# Patient Record
Sex: Male | Born: 1990 | Race: Black or African American | Hispanic: No | Marital: Single | Smoking: Never smoker
Health system: Southern US, Community
[De-identification: ages and names within clinical notes are randomized; demographics above are authoritative.]

## PROBLEM LIST (undated history)

## (undated) DIAGNOSIS — R229 Localized swelling, mass and lump, unspecified: Secondary | ICD-10-CM

## (undated) DIAGNOSIS — IMO0002 Reserved for concepts with insufficient information to code with codable children: Secondary | ICD-10-CM

---
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
## 2013-12-03 ENCOUNTER — Emergency Department (HOSPITAL_COMMUNITY)
Admission: EM | Admit: 2013-12-03 | Discharge: 2013-12-03 | Disposition: A | Discharge status: Home / Self Care | Payer: 59 | Attending: Emergency Medicine | Admitting: Emergency Medicine

## 2013-12-03 ENCOUNTER — Encounter (HOSPITAL_COMMUNITY): Payer: Self-pay | Admitting: Emergency Medicine

## 2013-12-03 DIAGNOSIS — M65839 Other synovitis and tenosynovitis, unspecified forearm: Secondary | ICD-10-CM | POA: Insufficient documentation

## 2013-12-03 DIAGNOSIS — M67439 Ganglion, unspecified wrist: Secondary | ICD-10-CM

## 2013-12-03 DIAGNOSIS — M778 Other enthesopathies, not elsewhere classified: Secondary | ICD-10-CM

## 2013-12-03 DIAGNOSIS — M65849 Other synovitis and tenosynovitis, unspecified hand: Secondary | ICD-10-CM

## 2013-12-03 DIAGNOSIS — M6740 Ganglion, unspecified site: Secondary | ICD-10-CM | POA: Insufficient documentation

## 2013-12-03 MED ORDER — NAPROXEN 500 MG PO TABS: 500.0000 mg | ORAL_TABLET | Freq: Two times a day (BID) | ORAL | Status: DC

## 2013-12-03 NOTE — Discharge Instructions (Signed)
Take the prescribed medication as directed to help with pain.  Wear splint for comfort. Follow-up with hand surgery for evaluation of surgical removal/drainage of your cyst. Return to the ED for new or worsening symptoms.

## 2013-12-03 NOTE — ED Provider Notes (Signed)
CSN: [id number]     Arrival date & time 12/03/13  1756 History   First MD Initiated Contact with Patient 12/03/13 1814     Chief Complaint  Patient presents with  . Wrist Pain    raised, swollen area on l/wrist     (Consider location/radiation/quality/duration/timing/severity/associated sxs/prior Treatment) The history is provided by the patient and medical records.   This is a 23 year old male with no significant past medical history presenting to the ED for "bump" on left wrist x 1 month.  States it has been increasing in size over the past 2 weeks. Patient also complains of a "nagging, aching" pain in his wrist while working.  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  Stats pain mostly occurs with movement of wrist, better when it is still.  Denies numbness or paresthesias.  He is right hand dominant.    History reviewed. No pertinent past medical history. History reviewed. No pertinent past surgical history. Family History  Problem Relation Age of Onset  . Diabetes Mother   . Hypertension Father   . Diabetes Father    History  Substance Use Topics  . Smoking status: Never Smoker   . Smokeless tobacco: Not on file  . Alcohol Use: Yes     Comment: occ    Review of Systems  Musculoskeletal: Positive for arthralgias.  All other systems reviewed and are negative.     Allergies  Review of patient's allergies indicates no known allergies.  Home Medications   Prior to Admission medications   Not on File   BP 129/64  Pulse 76  Temp(Src) 98.6 F (37 C) (Oral)  Resp 18  SpO2 100%  Physical Exam  Nursing note and vitals reviewed. Constitutional: He is oriented to person, place, and time. He appears well-developed and well-nourished. No distress.  HENT:  Head: Normocephalic and atraumatic.  Mouth/Throat: Oropharynx is clear and moist.  Eyes: Conjunctivae and EOM are normal. Pupils  are equal, round, and reactive to light.  Neck: Normal range of motion. Neck supple.  Cardiovascular: Normal rate, regular rhythm and normal heart sounds.   Pulmonary/Chest: Effort normal and breath sounds normal. No respiratory distress. He has no wheezes.  Musculoskeletal: Normal range of motion.  Left wrist with moderate sized ganglion cyst along dorsal aspect; full ROM of wrist maintained without difficulty; no gross bony deformities; strong radial pulse and cap refill; sensation intact diffusely throughout leg  Neurological: He is alert and oriented to person, place, and time.  Skin: Skin is warm and dry. He is not diaphoretic.  Psychiatric: He has a normal mood and affect.    ED Course  Procedures (including critical care time) Labs Review Labs Reviewed - No data to display  Imaging Review No results found.   EKG Interpretation None      MDM   Final diagnoses:  Ganglion cyst of wrist  Tendonitis of wrist, left   Ganglion cyst of dorsal left wrist, wrist is otherwise within normal limits and neurovascularly intact. He will be referred to hand surgery for surgical removal/drainage of cyst.  His wrist pain sx seem to be related to tendonitis given his repetitive motions.  Pt placed in wrist splint, started on naprosyn.  Discussed plan with patient, he/she acknowledged understanding and agreed with plan of care.  Return precautions given for new or worsening symptoms.  Garlon Hatchet, PA-C 12/03/13 1940

## 2013-12-03 NOTE — ED Notes (Signed)
Raised area on l/wrist x 1 month. Increased in size-approx. 1.5cm diameter.  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

## 2013-12-03 NOTE — ED Provider Notes (Signed)
Medical screening examination/treatment/procedure(s) were performed by non-physician practitioner and as supervising physician I was immediately available for consultation/collaboration.   EKG Interpretation None       Ethelda Chick, MD 12/03/13 1958

## 2013-12-09 ENCOUNTER — Encounter (HOSPITAL_BASED_OUTPATIENT_CLINIC_OR_DEPARTMENT_OTHER): Payer: Self-pay | Admitting: *Deleted

## 2013-12-09 NOTE — Progress Notes (Signed)
Pt instructed npo p mn tonight . Needs hgb on arrival

## 2013-12-17 ENCOUNTER — Encounter (HOSPITAL_BASED_OUTPATIENT_CLINIC_OR_DEPARTMENT_OTHER)
Admission: RE | Disposition: A | Discharge status: Home / Self Care | Payer: Self-pay | Source: Ambulatory Visit | Attending: Orthopedic Surgery

## 2013-12-17 ENCOUNTER — Encounter (HOSPITAL_BASED_OUTPATIENT_CLINIC_OR_DEPARTMENT_OTHER): Payer: Self-pay | Admitting: *Deleted

## 2013-12-17 ENCOUNTER — Ambulatory Visit (HOSPITAL_BASED_OUTPATIENT_CLINIC_OR_DEPARTMENT_OTHER): Payer: Managed Care, Other (non HMO) | Admitting: Anesthesiology

## 2013-12-17 ENCOUNTER — Encounter (HOSPITAL_BASED_OUTPATIENT_CLINIC_OR_DEPARTMENT_OTHER): Payer: Managed Care, Other (non HMO) | Admitting: Anesthesiology

## 2013-12-17 ENCOUNTER — Ambulatory Visit (HOSPITAL_BASED_OUTPATIENT_CLINIC_OR_DEPARTMENT_OTHER)
Admission: RE | Admit: 2013-12-17 | Discharge: 2013-12-17 | Disposition: A | Discharge status: Home / Self Care | Payer: Managed Care, Other (non HMO) | Source: Ambulatory Visit | Attending: Orthopedic Surgery | Admitting: Orthopedic Surgery

## 2013-12-17 DIAGNOSIS — R2232 Localized swelling, mass and lump, left upper limb: Secondary | ICD-10-CM

## 2013-12-17 DIAGNOSIS — Z79899 Other long term (current) drug therapy: Secondary | ICD-10-CM | POA: Insufficient documentation

## 2013-12-17 DIAGNOSIS — M6740 Ganglion, unspecified site: Secondary | ICD-10-CM | POA: Insufficient documentation

## 2013-12-17 HISTORY — DX: Localized swelling, mass and lump, unspecified: R22.9

## 2013-12-17 HISTORY — PX: MASS EXCISION: SHX2000

## 2013-12-17 HISTORY — DX: Reserved for concepts with insufficient information to code with codable children: IMO0002

## 2013-12-17 LAB — POCT HEMOGLOBIN-HEMACUE: HEMOGLOBIN: 14.2 g/dL (ref 13.0–17.0)

## 2013-12-17 SURGERY — EXCISION MASS
Anesthesia: General | Site: Wrist | Laterality: Left

## 2013-12-17 MED ORDER — FENTANYL CITRATE 0.05 MG/ML IJ SOLN
INTRAMUSCULAR | Status: DC | PRN
  Administered 2013-12-17: 50 ug via INTRAVENOUS

## 2013-12-17 MED ORDER — BUPIVACAINE HCL (PF) 0.25 % IJ SOLN
INTRAMUSCULAR | Status: DC | PRN
  Administered 2013-12-17: 10 mL

## 2013-12-17 MED ORDER — LIDOCAINE HCL (CARDIAC) 20 MG/ML IV SOLN
INTRAVENOUS | Status: DC | PRN
  Administered 2013-12-17: 100 mg via INTRAVENOUS

## 2013-12-17 MED ORDER — OXYCODONE-ACETAMINOPHEN 5-325 MG PO TABS
ORAL_TABLET | ORAL | Status: AC
  Filled 2013-12-17: qty 1

## 2013-12-17 MED ORDER — PROMETHAZINE HCL 25 MG/ML IJ SOLN
6.2500 mg | INTRAMUSCULAR | Status: DC | PRN
Start: 2013-12-17 — End: 2013-12-17
  Filled 2013-12-17: qty 1

## 2013-12-17 MED ORDER — DOCUSATE SODIUM 100 MG PO CAPS: 100.0000 mg | ORAL_CAPSULE | Freq: Two times a day (BID) | ORAL | Status: DC

## 2013-12-17 MED ORDER — DEXAMETHASONE SODIUM PHOSPHATE 4 MG/ML IJ SOLN
INTRAMUSCULAR | Status: DC | PRN
  Administered 2013-12-17: 10 mg via INTRAVENOUS

## 2013-12-17 MED ORDER — FENTANYL CITRATE 0.05 MG/ML IJ SOLN
25.0000 ug | INTRAMUSCULAR | Status: DC | PRN
Start: 2013-12-17 — End: 2013-12-17
  Filled 2013-12-17: qty 1

## 2013-12-17 MED ORDER — MIDAZOLAM HCL 5 MG/5ML IJ SOLN
INTRAMUSCULAR | Status: DC | PRN
  Administered 2013-12-17: 2 mg via INTRAVENOUS

## 2013-12-17 MED ORDER — LACTATED RINGERS IV SOLN
INTRAVENOUS | Status: DC
  Administered 2013-12-17 (×2): via INTRAVENOUS
  Filled 2013-12-17: qty 1000

## 2013-12-17 MED ORDER — OXYCODONE-ACETAMINOPHEN 5-325 MG PO TABS: 1.0000 | ORAL_TABLET | ORAL | Status: DC | PRN

## 2013-12-17 MED ORDER — FENTANYL CITRATE 0.05 MG/ML IJ SOLN
INTRAMUSCULAR | Status: AC
  Filled 2013-12-17: qty 4

## 2013-12-17 MED ORDER — CHLORHEXIDINE GLUCONATE 4 % EX LIQD
60.0000 mL | Freq: Once | CUTANEOUS | Status: DC
  Filled 2013-12-17: qty 60

## 2013-12-17 MED ORDER — OXYCODONE-ACETAMINOPHEN 5-325 MG PO TABS
1.0000 | ORAL_TABLET | Freq: Once | ORAL | Status: AC
  Administered 2013-12-17: 1 via ORAL
  Filled 2013-12-17: qty 1

## 2013-12-17 MED ORDER — PROPOFOL 10 MG/ML IV BOLUS
INTRAVENOUS | Status: DC | PRN
  Administered 2013-12-17: 200 mg via INTRAVENOUS
  Administered 2013-12-17: 100 mg via INTRAVENOUS

## 2013-12-17 MED ORDER — MIDAZOLAM HCL 2 MG/2ML IJ SOLN
INTRAMUSCULAR | Status: AC
  Filled 2013-12-17: qty 2

## 2013-12-17 MED ORDER — SODIUM CHLORIDE 0.9 % IR SOLN
Status: DC | PRN
  Administered 2013-12-17: 500 mL

## 2013-12-17 MED ORDER — ONDANSETRON HCL 4 MG/2ML IJ SOLN
INTRAMUSCULAR | Status: DC | PRN
  Administered 2013-12-17: 4 mg via INTRAVENOUS

## 2013-12-17 MED ORDER — CEFAZOLIN SODIUM-DEXTROSE 2-3 GM-% IV SOLR
2.0000 g | INTRAVENOUS | Status: AC
Start: 2013-12-17 — End: 2013-12-17
  Administered 2013-12-17: 2 g via INTRAVENOUS
  Filled 2013-12-17: qty 50

## 2013-12-17 SURGICAL SUPPLY — 46 items
BANDAGE ELASTIC 3 VELCRO ST LF (GAUZE/BANDAGES/DRESSINGS) ×6 IMPLANT
BANDAGE GAUZE ELAST BULKY 4 IN (GAUZE/BANDAGES/DRESSINGS) ×3 IMPLANT
BENZOIN TINCTURE PRP APPL 2/3 (GAUZE/BANDAGES/DRESSINGS) ×3 IMPLANT
BLADE SURG 15 STRL LF DISP TIS (BLADE) ×1 IMPLANT
BLADE SURG 15 STRL SS (BLADE) ×2
BNDG ESMARK 4X9 LF (GAUZE/BANDAGES/DRESSINGS) ×3 IMPLANT
BNDG GAUZE ELAST 4 BULKY (GAUZE/BANDAGES/DRESSINGS) ×3 IMPLANT
CLOSURE WOUND 1/2 X4 (GAUZE/BANDAGES/DRESSINGS) ×1
CORDS BIPOLAR (ELECTRODE) ×3 IMPLANT
COVER TABLE BACK 60X90 (DRAPES) ×3 IMPLANT
CUFF TOURNIQUET SINGLE 18IN (TOURNIQUET CUFF) ×3 IMPLANT
DRAPE EXTREMITY T 121X128X90 (DRAPE) ×3 IMPLANT
DRAPE LG THREE QUARTER DISP (DRAPES) ×6 IMPLANT
DRAPE SURG 17X23 STRL (DRAPES) ×3 IMPLANT
DRAPE U-SHAPE 47X51 STRL (DRAPES) IMPLANT
GLOVE BIOGEL PI IND STRL 8.5 (GLOVE) ×1 IMPLANT
GLOVE BIOGEL PI INDICATOR 8.5 (GLOVE) ×2
GLOVE SURG ORTHO 8.0 STRL STRW (GLOVE) ×3 IMPLANT
GOWN STRL REUS W/ TWL LRG LVL3 (GOWN DISPOSABLE) ×1 IMPLANT
GOWN STRL REUS W/ TWL XL LVL3 (GOWN DISPOSABLE) ×1 IMPLANT
GOWN STRL REUS W/TWL LRG LVL3 (GOWN DISPOSABLE) ×2
GOWN STRL REUS W/TWL XL LVL3 (GOWN DISPOSABLE) ×2
NEEDLE HYPO 25X1 1.5 SAFETY (NEEDLE) ×3 IMPLANT
NS IRRIG 500ML POUR BTL (IV SOLUTION) ×3 IMPLANT
PACK BASIN DAY SURGERY FS (CUSTOM PROCEDURE TRAY) ×3 IMPLANT
PAD CAST 3X4 CTTN HI CHSV (CAST SUPPLIES) ×1 IMPLANT
PADDING CAST ABS 3INX4YD NS (CAST SUPPLIES) ×2
PADDING CAST ABS COTTON 3X4 (CAST SUPPLIES) ×1 IMPLANT
PADDING CAST COTTON 3X4 STRL (CAST SUPPLIES) ×2
SPLINT FIBERGLASS 3X35 (CAST SUPPLIES) ×3 IMPLANT
SPLINT PLASTER CAST XFAST 3X15 (CAST SUPPLIES) IMPLANT
SPLINT PLASTER XTRA FASTSET 3X (CAST SUPPLIES)
SPONGE GAUZE 4X4 12PLY (GAUZE/BANDAGES/DRESSINGS) ×3 IMPLANT
SPONGE GAUZE 4X4 12PLY STER LF (GAUZE/BANDAGES/DRESSINGS) ×3 IMPLANT
STOCKINETTE 4X48 STRL (DRAPES) ×3 IMPLANT
STRIP CLOSURE SKIN 1/2X4 (GAUZE/BANDAGES/DRESSINGS) ×2 IMPLANT
SUCTION FRAZIER TIP 10 FR DISP (SUCTIONS) ×3 IMPLANT
SUT MNCRL AB 4-0 PS2 18 (SUTURE) ×3 IMPLANT
SUT PROLENE 4 0 PS 2 18 (SUTURE) ×3 IMPLANT
SYR BULB 3OZ (MISCELLANEOUS) ×3 IMPLANT
SYR CONTROL 10ML LL (SYRINGE) ×3 IMPLANT
TOWEL OR 17X24 6PK STRL BLUE (TOWEL DISPOSABLE) ×3 IMPLANT
TRAY DSU PREP LF (CUSTOM PROCEDURE TRAY) ×3 IMPLANT
TUBE CONNECTING 12'X1/4 (SUCTIONS) ×1
TUBE CONNECTING 12X1/4 (SUCTIONS) ×2 IMPLANT
UNDERPAD 30X30 INCONTINENT (UNDERPADS AND DIAPERS) ×3 IMPLANT

## 2013-12-17 NOTE — Brief Op Note (Signed)
12/17/2013  1:04 PM  PATIENT:  Francisco Patterson  23 y.o. male  PRE-OPERATIVE DIAGNOSIS:  LEFT WRIST DEEP MASS   POST-OPERATIVE DIAGNOSIS:  SAME  PROCEDURE:  Procedure(s): LEFT WRIST DEEP MASS EXCISION (Left)  SURGEON:  Surgeon(s) and Role:    * Sharma CovertFred W Ortmann, MD - Primary  PHYSICIAN ASSISTANT:   ASSISTANTS: none   ANESTHESIA:   general  EBL:     BLOOD ADMINISTERED:none  DRAINS: none   LOCAL MEDICATIONS USED:  MARCAINE     SPECIMEN:  No Specimen  DISPOSITION OF SPECIMEN:  N/A  COUNTS:  YES  TOURNIQUET:    DICTATIO: [id number]  PLAN OF CARE: Discharge to home after PACU  PATIENT DISPOSITION:  PACU - hemodynamically stable.   Delay start of Pharmacological VTE agent (>24hrs) due to surgical blood loss or risk of bleeding: not applicable

## 2013-12-17 NOTE — Anesthesia Procedure Notes (Signed)
Procedure Name: LMA Insertion Date/Time: 12/17/2013 2:07 PM Performed by: Tyrone NineSAUVE, ROBIN F Pre-anesthesia Checklist: Patient identified, Timeout performed, Emergency Drugs available, Suction available and Patient being monitored Patient Re-evaluated:Patient Re-evaluated prior to inductionOxygen Delivery Method: Circle system utilized Preoxygenation: Pre-oxygenation with 100% oxygen Intubation Type: IV induction Ventilation: Mask ventilation without difficulty LMA: LMA inserted LMA Size: 5.0 Number of attempts: 1 Placement Confirmation: breath sounds checked- equal and bilateral Tube secured with: Tape Dental Injury: Teeth and Oropharynx as per pre-operative assessment

## 2013-12-17 NOTE — Anesthesia Postprocedure Evaluation (Signed)
Anesthesia Post Note  Patient: Francisco Patterson  Procedure(s) Performed: Procedure(s) (LRB): LEFT WRIST DEEP MASS EXCISION (Left)  Anesthesia type: General  Patient location: PACU  Post pain: Pain level controlled  Post assessment: Post-op Vital signs reviewed  Last Vitals: BP 122/72  Pulse 62  Temp(Src) 36.3 C (Oral)  Resp 14  Ht 6\' 1"  (1.854 m)  Wt 185 lb (83.915 kg)  BMI 24.41 kg/m2  SpO2 100%  Post vital signs: Reviewed  Level of consciousness: sedated  Complications: No apparent anesthesia complications

## 2013-12-17 NOTE — Anesthesia Preprocedure Evaluation (Signed)

## 2013-12-17 NOTE — H&P (Signed)
Francisco Patterson is an 23 y.o. male.   Chief Complaint: LEFT WRIST MASS HPI: PT WITH ENLARGING MASS OVER DORSUM OF LEFT WRIST PT HERE FOR SURGERY ON LEFT WRIST TO REMOVE MASS NO PRIOR SURGERY TO LEFT WRIST   Past Medical History  Diagnosis Date  . Mass     left wrist    History reviewed. No pertinent past surgical history.  Family History  Problem Relation Age of Onset  . Diabetes Mother   . Hypertension Father   . Diabetes Father    Social History:  reports that he has never smoked. He does not have any smokeless tobacco history on file. He reports that he drinks alcohol. His drug history is not on file.  Allergies: No Known Allergies  Medications Prior to Admission  Medication Sig Dispense Refill  . naproxen (NAPROSYN) 500 MG tablet Take 1 tablet (500 mg total) by mouth 2 (two) times daily with a meal.  30 tablet  0    Results for orders placed during the hospital encounter of 12/17/13 (from the past 48 hour(s))  POCT HEMOGLOBIN-HEMACUE     Status: None   Collection Time    12/17/13 12:04 PM      Result Value Ref Range   Hemoglobin 14.2  13.0 - 17.0 g/dL   No results found.  ROS NO RECENT ILLNESSES OR HOSPITALIZATIONS   Blood pressure 128/74, pulse 71, temperature 98.1 F (36.7 C), temperature source Oral, resp. rate 16, height 6\' 1"  (1.854 m), weight 83.915 kg (185 lb), SpO2 100.00%. Physical Exam  General Appearance:  Alert, cooperative, no distress, appears stated age  Head:  Normocephalic, without obvious abnormality, atraumatic  Eyes:  Pupils equal, conjunctiva/corneas clear,         Throat: Lips, mucosa, and tongue normal; teeth and gums normal  Neck: No visible masses     Lungs:   respirations unlabored  Chest Wall:  No tenderness or deformity  Heart:  Regular rate and rhythm,  Abdomen:   Soft, non-tender,         Extremities: LEFT WRIST: SEVERAL CM MASS OVER DORSUM OF LEFT HAND 4 CM IN SIZE OVER SL REGION GOOD WRIST AND DIGITAL MOTION FINGERS  WARM WELL PERFUSED  Pulses: 2+ and symmetric  Skin: Skin color, texture, turgor normal, no rashes or lesions     Neurologic: Normal    Assessment/Plan LEFT WRIST DORSAL DEEP MASS  LEFT WRIST DEEP MASS EXCISION  R/B/A DISCUSSED WITH PT IN OFFICE.  PT VOICED UNDERSTANDING OF PLAN CONSENT SIGNED DAY OF SURGERY PT SEEN AND EXAMINED PRIOR TO OPERATIVE PROCEDURE/DAY OF SURGERY SITE MARKED. QUESTIONS ANSWERED WILL GO HOME FOLLOWING SURGERY  Sharma CovertORTMANN,FRED W 12/17/2013, 1:01 PM

## 2013-12-17 NOTE — Transfer of Care (Signed)
Immediate Anesthesia Transfer of Care Note  Patient: Francisco Patterson  Procedure(s) Performed: Procedure(s): LEFT WRIST DEEP MASS EXCISION (Left)  Patient Location: PACU  Anesthesia Type:General  Level of Consciousness: sedated and patient cooperative  Airway & Oxygen Therapy: Patient Spontanous Breathing and Patient connected to nasal cannula oxygen  Post-op Assessment: Report given to PACU RN and Post -op Vital signs reviewed and stable  Post vital signs: Reviewed and stable  Complications: No apparent anesthesia complications

## 2013-12-17 NOTE — Discharge Instructions (Signed)
KEEP BANDAGE CLEAN AND DRY °CALL OFFICE FOR F/U APPT [phone] in 14 days °KEEP HAND ELEVATED ABOVE HEART °OK TO APPLY ICE TO OPERATIVE AREA °CONTACT OFFICE IF ANY WORSENING PAIN OR CONCERNS. ° ° °Post Anesthesia Home Care Instructions ° °Activity: °Get plenty of rest for the remainder of the day. A responsible adult should stay with you for 24 hours following the procedure.  °For the next 24 hours, DO NOT: °-Drive a car °-Operate machinery °-Drink alcoholic beverages °-Take any medication unless instructed by your physician °-Make any legal decisions or sign important papers. ° °Meals: °Start with liquid foods such as gelatin or soup. Progress to regular foods as tolerated. Avoid greasy, spicy, heavy foods. If nausea and/or vomiting occur, drink only clear liquids until the nausea and/or vomiting subsides. Call your physician if vomiting continues. ° °Special Instructions/Symptoms: °Your throat may feel dry or sore from the anesthesia or the breathing tube placed in your throat during surgery. If this causes discomfort, gargle with warm salt water. The discomfort should disappear within 24 hours. ° °

## 2013-12-18 ENCOUNTER — Encounter (HOSPITAL_BASED_OUTPATIENT_CLINIC_OR_DEPARTMENT_OTHER): Payer: Self-pay | Admitting: Orthopedic Surgery

## 2013-12-18 NOTE — Op Note (Signed)
NAMRodell Patterson:  Francisco Patterson, Francisco Patterson              ACCOUNT NO.:  [id number]  MEDICAL RECORD NO.:  [id number]  LOCATION:                                 FACILITY:  PHYSICIAN:  Madelynn DoneFred W Ortmann IV, MD  DATE OF BIRTH:  1991-03-06  DATE OF PROCEDURE:  12/17/2013 DATE OF DISCHARGE:  12/17/2013                              OPERATIVE REPORT   PREOPERATIVE DIAGNOSIS:  Left wrist deep mass, greater than 1.5 cm, subfascial.  POSTOPERATIVE DIAGNOSIS:  Left wrist deep mass, greater than 1.5 cm, subfascial.  ATTENDING PHYSICIAN:  Francisco CovertFred W Ortmann IV, MD, who was scrubbed and present for the entire procedure.  ASSISTANT SURGEON:  None.  ANESTHESIA:  General via LMA.  SURGICAL PROCEDURES: 1. Left wrist deep mass excision, subfascial, greater than 1.5 cm. 2. Left wrist joint arthrotomy and exploration, radiocarpal joint.  SURGICAL INDICATIONS:  Mr. Francisco Patterson is a right-hand-dominant gentleman with enlarging mass over the dorsal aspect of the left hand.  The patient elected to undergo the above procedure.  Risks, benefits, and alternatives were discussed in detail with the patient.  Signed informed consent was obtained.  Risks include, but are not limited to bleeding; infection; damage to nearby nerves, arteries, or tendons; loss of motion of the wrist and digits; incomplete relief of symptoms; and need for further surgical intervention.  DESCRIPTION OF PROCEDURE:  The patient was properly identified in the preop holding area, marked with a permanent marker made on the left wrist to indicate the correct operative site.  The patient was then brought back to the operating room and placed supine on the anesthesia room table where general anesthesia was administered.  The patient tolerated this well.  A well-padded tourniquet was then placed on the left brachium and sealed with 1000-drape.  Left upper extremity was then prepped and draped in normal sterile fashion.  Time-out was called, correct side was  identified and procedure was then begun.  Attention was then turned to the left wrist where a large skin incision was made then directly over the dorsal aspect of the wrist.  The limb was then elevated and tourniquet insufflated.  Dissection was carried down through the skin and subcutaneous tissues deep to the retinaculum between the fourth and second dorsal compartment.  The mass was encountered, greater than 1.5 cm mass.  Circumferential dissection was then carried around the mass particularly all the way down to the base of the mass.  The radiocarpal joint was then opened up at the level of the SL ligament, the mass appeared to be originating consistent with a ganglion cyst.  The mass was then removed at the base of the stalk. Bipolar cautery was then used to cauterize the base of the stalk, preserving the dorsal capsuloligamentous structures.  Following this, the wound was then thoroughly irrigated after arthrotomy and removal of the deep mass.  The wound was then irrigated.  Capsular closure was then carried out with 4-0 Monocryl suture.  Subcutaneous tissue was closed with 4-0 Monocryl and skin closed with running 4-0 Prolene subcuticular. Benzoin and Steri-Strips were applied.  Sterile compressive bandage was applied.  The patient was then placed in a short-arm splint, extubated, and taken to the  recovery room in good condition.  POSTPROCEDURAL PLAN:  The patient was discharged to home and seen back in the office in approximately 2 weeks for wound check, suture removal, and go over the pathology, gradually use and activity.  SPECIMENS:  Dorsal mass to Pathology.     Madelynn DoneFred W Ortmann IV, MD     FWO/MEDQ  D:  12/17/2013  T:  12/18/2013  Job:  [id number]

## 2016-02-08 ENCOUNTER — Emergency Department (HOSPITAL_COMMUNITY)
Admission: EM | Admit: 2016-02-08 | Discharge: 2016-02-08 | Disposition: A | Discharge status: Home / Self Care | Payer: Self-pay | Attending: Emergency Medicine | Admitting: Emergency Medicine

## 2016-02-08 ENCOUNTER — Encounter (HOSPITAL_COMMUNITY): Payer: Self-pay | Admitting: *Deleted

## 2016-02-08 ENCOUNTER — Emergency Department (HOSPITAL_COMMUNITY): Payer: Self-pay

## 2016-02-08 DIAGNOSIS — N202 Calculus of kidney with calculus of ureter: Secondary | ICD-10-CM | POA: Insufficient documentation

## 2016-02-08 DIAGNOSIS — N134 Hydroureter: Secondary | ICD-10-CM | POA: Insufficient documentation

## 2016-02-08 DIAGNOSIS — N200 Calculus of kidney: Secondary | ICD-10-CM

## 2016-02-08 DIAGNOSIS — N1330 Unspecified hydronephrosis: Secondary | ICD-10-CM | POA: Insufficient documentation

## 2016-02-08 LAB — URINALYSIS, ROUTINE W REFLEX MICROSCOPIC
Glucose, UA: NEGATIVE mg/dL
Ketones, ur: >80 mg/dL — AB
Nitrite: NEGATIVE
PH: 6 (ref 5.0–8.0)
Protein, ur: 30 mg/dL — AB
SPECIFIC GRAVITY, URINE: 1.039 — AB (ref 1.005–1.030)

## 2016-02-08 LAB — COMPREHENSIVE METABOLIC PANEL
ALK PHOS: 51 U/L (ref 38–126)
ALT: 23 U/L (ref 17–63)
AST: 27 U/L (ref 15–41)
Albumin: 4.5 g/dL (ref 3.5–5.0)
Anion gap: 8 (ref 5–15)
BILIRUBIN TOTAL: 0.6 mg/dL (ref 0.3–1.2)
BUN: 11 mg/dL (ref 6–20)
CALCIUM: 10 mg/dL (ref 8.9–10.3)
CO2: 26 mmol/L (ref 22–32)
Chloride: 103 mmol/L (ref 101–111)
Creatinine, Ser: 1.07 mg/dL (ref 0.61–1.24)
GFR calc non Af Amer: >60 mL/min (ref >60)
GLUCOSE: 100 mg/dL — AB (ref 65–99)
POTASSIUM: 3.7 mmol/L (ref 3.5–5.1)
SODIUM: 137 mmol/L (ref 135–145)
TOTAL PROTEIN: 8.2 g/dL — AB (ref 6.5–8.1)

## 2016-02-08 LAB — CBC
HEMATOCRIT: 43.6 % (ref 39.0–52.0)
HEMOGLOBIN: 14.1 g/dL (ref 13.0–17.0)
MCH: 28.7 pg (ref 26.0–34.0)
MCHC: 32.3 g/dL (ref 30.0–36.0)
MCV: 88.6 fL (ref 78.0–100.0)
Platelets: 263 10*3/uL (ref 150–400)
RBC: 4.92 MIL/uL (ref 4.22–5.81)
RDW: 12.7 % (ref 11.5–15.5)
WBC: 11.9 10*3/uL — ABNORMAL HIGH (ref 4.0–10.5)

## 2016-02-08 LAB — URINE MICROSCOPIC-ADD ON: Bacteria, UA: NONE SEEN

## 2016-02-08 LAB — LIPASE, BLOOD: Lipase: 15 U/L (ref 11–51)

## 2016-02-08 MED ORDER — SODIUM CHLORIDE 0.9 % IV BOLUS (SEPSIS)
1000.0000 mL | Freq: Once | INTRAVENOUS | Status: AC
  Administered 2016-02-08: 1000 mL via INTRAVENOUS

## 2016-02-08 MED ORDER — KETOROLAC TROMETHAMINE 30 MG/ML IJ SOLN
30.0000 mg | Freq: Once | INTRAMUSCULAR | Status: AC
  Administered 2016-02-08: 30 mg via INTRAVENOUS
  Filled 2016-02-08: qty 1

## 2016-02-08 MED ORDER — OXYCODONE-ACETAMINOPHEN 5-325 MG PO TABS: 1.0000 | ORAL_TABLET | ORAL | 0 refills | Status: DC | PRN

## 2016-02-08 MED ORDER — ONDANSETRON 4 MG PO TBDP: 4.0000 mg | ORAL_TABLET | Freq: Three times a day (TID) | ORAL | 0 refills | Status: DC | PRN

## 2016-02-08 NOTE — Discharge Instructions (Signed)
Take your medications as prescribed as needed for pain relief. Continue drinking fluids at home to remain hydrated. I recommend calling the Urology clinic listed above to schedule a follow up appointment for sometime in the next week for follow up regarding your enlarged prostate seen on your CT.  Please follow up with a primary care provider from the Resource Guide provided below in 1 week as needed. Please return to the Emergency Department if symptoms worsen or new onset of fever, chills, abdominal pain, vomiting, unable to keep fluids down, difficulty urinating, rectal pain.

## 2016-02-08 NOTE — ED Notes (Signed)
D/C instructions reveiwed and patient verbalizes understanding. Leaving ambulatory with minimal pain

## 2016-02-08 NOTE — ED Triage Notes (Signed)
Pt reports RLQ pain this am with n/v and blood in urine.

## 2016-02-08 NOTE — ED Provider Notes (Signed)
[facility]EM[facility]Y DEPT Provider Note   CSN: [id number] Arrival date & time: 02/08/16  [id number]     History   Chief Complaint Chief Complaint  Patient presents with  . Abdominal Pain  . Emesis  . Hematuria    HPI Francisco Patterson is a 25 y.o. male.  Patient is a 25 year old male with no pertinent past medical history presents the ED with complaint of abdominal pain, onset around 8 PM last night. Patient reports after eating pizza and drinking soda last night he began to have gradual onset of sharp waxing and waning right-sided abdominal pain. Patient reports pain is worse with certain movements. Denies radiation. He reports initially thinking the pain was related to gas but notes he has taken ibuprofen and Gas-X at home without relief. Patient reports having hematuria ("brown colored urine"), nausea and 3 episodes of NBNB vomiting. Denies fever, chills, headache or chest pain, shortness of breath, hematemesis, diarrhea, constipation, flank pain, dysuria, urinary frequency. Denies history of abdominal surgeries.      Past Medical History:  Diagnosis Date  . Mass    left wrist    There are no active problems to display for this patient.   Past Surgical History:  Procedure Laterality Date  . MASS EXCISION Left 12/17/2013   Procedure: LEFT WRIST DEEP MASS EXCISION;  Surgeon: Sharma CovertFred W Ortmann, MD;  [facility];  Service: Orthopedics;  Laterality: Left;       Home Medications    Prior to Admission medications   Medication Sig Start Date End Date Taking? Authorizing Provider  Naproxen Sodium (ALEVE) 220 MG CAPS Take 220 mg by mouth every 8 (eight) hours as needed (pain).   Yes Historical Provider, MD  simethicone (MYLICON) 80 MG chewable tablet Chew 80 mg by mouth every 6 (six) hours as needed for flatulence.   Yes Historical Provider, MD  ondansetron (ZOFRAN ODT) 4 MG disintegrating tablet Take 1 tablet (4 mg total) by mouth every 8 (eight) hours as needed  for nausea or vomiting. 02/08/16   Barrett HenleNicole Elizabeth Traniya Prichett, PA-C  oxyCODONE-acetaminophen (PERCOCET/ROXICET) 5-325 MG tablet Take 1 tablet by mouth every 4 (four) hours as needed for severe pain. 02/08/16   Barrett HenleNicole Elizabeth Avis Mcmahill, PA-C    Family History Family History  Problem Relation Age of Onset  . Diabetes Mother   . Hypertension Father   . Diabetes Father     Social History Social History  Substance Use Topics  . Smoking status: Never Smoker  . Smokeless tobacco: Never Used  . Alcohol use Yes     Comment: occ     Allergies   Review of patient's allergies indicates no known allergies.   Review of Systems Review of Systems  Gastrointestinal: Positive for abdominal pain, nausea and vomiting.  Genitourinary: Positive for hematuria.  All other systems reviewed and are negative.    Physical Exam Updated Vital Signs BP 125/66   Pulse 64   Temp 98.4 F (36.9 C) (Oral)   Resp 18   SpO2 100%   Physical Exam  Constitutional: He is oriented to person, place, and time. He appears well-developed and well-nourished. No distress.  HENT:  Head: Normocephalic and atraumatic.  Mouth/Throat: Oropharynx is clear and moist. No oropharyngeal exudate.  Eyes: Conjunctivae and EOM are normal. Right eye exhibits no discharge. Left eye exhibits no discharge. No scleral icterus.  Neck: Normal range of motion. Neck supple.  Cardiovascular: Normal rate, regular rhythm, normal heart sounds and intact distal pulses.  Pulmonary/Chest: Effort normal and breath sounds normal.  Abdominal: Soft. Bowel sounds are normal. He exhibits no distension and no mass. There is no tenderness. There is no rebound and no guarding. No hernia.  No CVA tenderness  Musculoskeletal: Normal range of motion. He exhibits no edema.  Neurological: He is alert and oriented to person, place, and time.  Skin: Skin is warm and dry. He is not diaphoretic.  Nursing note and vitals reviewed.    ED Treatments / Results   Labs (all labs ordered are listed, but only abnormal results are displayed) Labs Reviewed  COMPREHENSIVE METABOLIC PANEL - Abnormal; Notable for the following:       Result Value   Glucose, Bld 100 (*)    Total Protein 8.2 (*)    All other components within normal limits  CBC - Abnormal; Notable for the following:    WBC 11.9 (*)    All other components within normal limits  URINALYSIS, ROUTINE W REFLEX MICROSCOPIC (NOT AT [facility]) - Abnormal; Notable for the following:    Color, Urine AMBER (*)    APPearance CLOUDY (*)    Specific Gravity, Urine 1.039 (*)    Hgb urine dipstick LARGE (*)    Bilirubin Urine SMALL (*)    Ketones, ur >80 (*)    Protein, ur 30 (*)    Leukocytes, UA MODERATE (*)    All other components within normal limits  URINE MICROSCOPIC-ADD ON - Abnormal; Notable for the following:    Squamous Epithelial / LPF 0-5 (*)    All other components within normal limits  LIPASE, BLOOD    EKG  EKG Interpretation None       Radiology Ct Renal Stone Study  Result Date: 02/08/2016 CLINICAL DATA:  Right abdominal pain. Hematuria. Nausea and vomiting. EXAM: CT ABDOMEN AND PELVIS WITHOUT CONTRAST TECHNIQUE: Multidetector CT imaging of the abdomen and pelvis was performed following the standard protocol without IV contrast. COMPARISON:  None. FINDINGS: Lower chest:  Unremarkable Hepatobiliary: Numerous hypodense lesions are present in the liver enter technically nonspecific on today' s exam. An index 1.1 cm lesion in segment 2 is noted on image 12 series 2. There are at least 15 such lesions. Pancreas: Unremarkable Spleen: Unremarkable Adrenals/Urinary Tract: Adrenal glands normal. Right perirenal stranding along with mild right hydronephrosis and hydroureter extending down to a 2 mm right ureterovesical junction calculus. Additional right nonobstructive renal calculi include a 2 mm mid kidney calculus and a separate 2 mm right mid kidney calculus. On the left side there is a 3  mm calculus with an adjacent 2 mm calculus, as well as some very faint potential 1 mm nonobstructive calculi. No left hydronephrosis or hydroureter. The urinary bladder is empty during imaging but also somewhat high density and with mild surrounding stranding, cystitis is not excluded. No well-defined lumen in the urinary bladder. Stomach/Bowel: Appendix normal.  Otherwise unremarkable. Vascular/Lymphatic: Unremarkable Reproductive: There is prominence of the seminal vesicles and prostate gland for age. The prostate gland measures 4.4 by 5.7 by 5.9 cm (volume = 77 cm^3). There is also some ill-defined stranding around the urinary bladder and seminal vesicles, and prostatitis is not readily excluded. Other: No supplemental non-categorized findings. Musculoskeletal: Several Schmorl' s nodes are present and there is some physiologic wedging at L2. Mild disc bulges at L4-5 and L5-S1. No lumbar impingement identified. Advanced for age spurring of both femoral heads. IMPRESSION: 1. 2 mm right ureterovesical junction calculus associated with mild right hydronephrosis and mild right hydroureter.  2. **An incidental finding of potential clinical significance has been found. Scattered hypodense lesions are present in the liver better technically nonspecific, although some of these may represent cysts. Consider follow up hepatic protocol MRI with and without contrast in order to definitively characterize these liver lesions.** 3. Bilateral nonobstructive nephrolithiasis is also present. 4. Abnormally thick-walled urinary bladder with prominent prostate gland and stranding around the bladder and seminal vesicles. I cannot exclude cystitis or prostatitis given this unusual appearance. 5. Mild disc bulges at L4-5 and L5-S1, without impingement. 6. Mild but advanced for age spurring of both femoral heads. Electronically Signed   By: Gaylyn RongWalter  Liebkemann M.D.   On: 02/08/2016 15:19    Procedures Procedures (including critical care  time)  Medications Ordered in ED Medications  sodium chloride 0.9 % bolus 1,000 mL (0 mLs Intravenous Stopped 02/08/16 1245)  ketorolac (TORADOL) 30 MG/ML injection 30 mg (30 mg Intravenous Given 02/08/16 1043)     Initial Impression / Assessment and Plan / ED Course  I have reviewed the triage vital signs and the nursing notes.  Pertinent labs & imaging results that were available during my care of the patient were reviewed by me and considered in my medical decision making (see chart for details).  Clinical Course    Patient presents with right-sided abdominal pain, hematuria, nausea and vomiting that started last night. Denies fever. VSS. Exam unremarkable, no abdominal tenderness on exam, no CVA tenderness. Patient given IV fluids and Toradol. Denies currently having any nausea.  UA positive for large hemoglobin, TNTC RBCs, small bilirubin, ketones and protein. CBC 11.7. Remaining labs unremarkable. Will order CT renal study for evaluation of kidney stone. On reevaluation patient reports his pain has improved, denies any nausea or vomiting.   CT showed 2 mm right ureterovesicular junction calculus associated with mild right hydronephrosis. Incidental finding of scattered hypodense lesions present in the liver thought to represent cysts, recommend follow-up hepatic protocol MRI with and without contrast. CT also showed walled urinary bladder and prominent prostate gland and stranding around the bladder and seminal vesicles noted, unable to exclude cystitis or prostatitis. UA does not show signs of infection. Patient denies fever, urinary frequency, dysuria, perineal pain, rectal pain. I do not suspect cystitis or prostatitis and do not feel that antibiotics are warranted at this time. However, plan to have patient follow up with urology in the next week for follow-up. Discussed results and plan for discharge with patient. Plan to discharge patient home with pain meds and symptomatically  treatment for kidney stone. Discussed return precautions with patient.  Final Clinical Impressions(s) / ED Diagnoses   Final diagnoses:  Kidney stone    New Prescriptions New Prescriptions   ONDANSETRON (ZOFRAN ODT) 4 MG DISINTEGRATING TABLET    Take 1 tablet (4 mg total) by mouth every 8 (eight) hours as needed for nausea or vomiting.   OXYCODONE-ACETAMINOPHEN (PERCOCET/ROXICET) 5-325 MG TABLET    Take 1 tablet by mouth every 4 (four) hours as needed for severe pain.     Satira Sarkicole Elizabeth [place]adeau, [place] JerseyPA-C 02/08/16 1603    Donnetta HutchingBrian Cook, MD 02/12/16 [phone]

## 2016-02-12 ENCOUNTER — Emergency Department (HOSPITAL_COMMUNITY)
Admission: EM | Admit: 2016-02-12 | Discharge: 2016-02-12 | Disposition: A | Discharge status: Home / Self Care | Payer: Managed Care, Other (non HMO) | Attending: Emergency Medicine | Admitting: Emergency Medicine

## 2016-02-12 ENCOUNTER — Encounter (HOSPITAL_COMMUNITY): Payer: Self-pay | Admitting: Emergency Medicine

## 2016-02-12 DIAGNOSIS — R1084 Generalized abdominal pain: Secondary | ICD-10-CM

## 2016-02-12 DIAGNOSIS — Z711 Person with feared health complaint in whom no diagnosis is made: Secondary | ICD-10-CM

## 2016-02-12 DIAGNOSIS — R109 Unspecified abdominal pain: Secondary | ICD-10-CM

## 2016-02-12 DIAGNOSIS — Z202 Contact with and (suspected) exposure to infections with a predominantly sexual mode of transmission: Secondary | ICD-10-CM | POA: Insufficient documentation

## 2016-02-12 LAB — HIV ANTIBODY (ROUTINE TESTING W REFLEX): HIV Screen 4th Generation wRfx: NONREACTIVE

## 2016-02-12 LAB — BASIC METABOLIC PANEL
ANION GAP: 7 (ref 5–15)
BUN: 6 mg/dL (ref 6–20)
CALCIUM: 9.5 mg/dL (ref 8.9–10.3)
CO2: 28 mmol/L (ref 22–32)
CREATININE: 1.13 mg/dL (ref 0.61–1.24)
Chloride: 102 mmol/L (ref 101–111)
Glucose, Bld: 109 mg/dL — ABNORMAL HIGH (ref 65–99)
Potassium: 3.4 mmol/L — ABNORMAL LOW (ref 3.5–5.1)
SODIUM: 137 mmol/L (ref 135–145)

## 2016-02-12 LAB — URINALYSIS, ROUTINE W REFLEX MICROSCOPIC
BILIRUBIN URINE: NEGATIVE
Glucose, UA: NEGATIVE mg/dL
KETONES UR: 15 mg/dL — AB
Nitrite: NEGATIVE
Protein, ur: >300 mg/dL — AB
Specific Gravity, Urine: 1.018 (ref 1.005–1.030)
pH: 6.5 (ref 5.0–8.0)

## 2016-02-12 LAB — URINE MICROSCOPIC-ADD ON

## 2016-02-12 LAB — CBC
HCT: 41.2 % (ref 39.0–52.0)
Hemoglobin: 13.5 g/dL (ref 13.0–17.0)
MCH: 28.8 pg (ref 26.0–34.0)
MCHC: 32.8 g/dL (ref 30.0–36.0)
MCV: 88 fL (ref 78.0–100.0)
PLATELETS: 208 10*3/uL (ref 150–400)
RBC: 4.68 MIL/uL (ref 4.22–5.81)
RDW: 12.7 % (ref 11.5–15.5)
WBC: 9.9 10*3/uL (ref 4.0–10.5)

## 2016-02-12 MED ORDER — SULFAMETHOXAZOLE-TRIMETHOPRIM 800-160 MG PO TABS: 1.0000 | ORAL_TABLET | Freq: Two times a day (BID) | ORAL | 0 refills | Status: AC

## 2016-02-12 MED ORDER — OXYCODONE HCL 5 MG PO TABS: 10.0000 mg | ORAL_TABLET | Freq: Four times a day (QID) | ORAL | 0 refills | Status: DC | PRN

## 2016-02-12 MED ORDER — DEXTROSE 5 % IV SOLN
1.0000 g | Freq: Once | INTRAVENOUS | Status: AC
  Administered 2016-02-12: 1 g via INTRAVENOUS
  Filled 2016-02-12: qty 10

## 2016-02-12 MED ORDER — MORPHINE SULFATE (PF) 4 MG/ML IV SOLN
4.0000 mg | Freq: Once | INTRAVENOUS | Status: AC
  Administered 2016-02-12: 4 mg via INTRAVENOUS
  Filled 2016-02-12: qty 1

## 2016-02-12 MED ORDER — AZITHROMYCIN 250 MG PO TABS
1000.0000 mg | ORAL_TABLET | Freq: Once | ORAL | Status: AC
  Administered 2016-02-12: 1000 mg via ORAL
  Filled 2016-02-12: qty 4

## 2016-02-12 MED ORDER — SODIUM CHLORIDE 0.9 % IV BOLUS (SEPSIS)
1000.0000 mL | Freq: Once | INTRAVENOUS | Status: AC
  Administered 2016-02-12: 1000 mL via INTRAVENOUS

## 2016-02-12 NOTE — ED Provider Notes (Signed)
[facility] DEPT Provider Note   CSN: [id number] Arrival date & time: 02/12/16  0012     History   Chief Complaint Chief Complaint  Patient presents with  . Flank Pain    HPI Francisco Patterson is a 25 y.o. male.  Francisco Patterson is a 25 y.o. male  with no major medical problems presents to the Emergency Department complaining of gradual, persistent, progressively worsening right flank and suprapubic abdominal pain until this morning around 10 AM. Patient reports he was seen for similar pain 3 days ago. In that time he was diagnosed with kidney stone. He reports that 2 days ago he passed a kidney stone and his symptoms resolved for 24 hours.  He reports new symptoms of dysuria, hematuria, urinary urgency and urinary frequency this morning. He denies fevers or chills, nausea or vomiting.  He reports last bowel movement was yesterday and was not painful. He continued to deny rectal pain.  Patient reports he took his oxycodone at home without relief.  He denies penile discharge. He reports that his testicles feel "heavy" but they are not painful.  Record review shows that patient was evaluated on 02/08/2016. At that time CT showed 2 mm right UVJ stone with mild right hydronephrosis. It also showed walled urinary bladder and prominent prostate gland with stranding around the bladder and seminal vesicles.  At that time patient's presentation was more consistent with renal call like he was not started on any antibiotics.  The history is provided by the patient and medical records. No language interpreter was used.    The history is provided by the patient and medical records. No language interpreter was used.    Past Medical History:  Diagnosis Date  . Mass    left wrist    There are no active problems to display for this patient.   Past Surgical History:  Procedure Laterality Date  . MASS EXCISION Left 12/17/2013   Procedure: LEFT WRIST DEEP MASS EXCISION;  Surgeon: Sharma CovertFred W  Ortmann, MD;  [facility];  Service: Orthopedics;  Laterality: Left;       Home Medications    Prior to Admission medications   Medication Sig Start Date End Date Taking? Authorizing Provider  Naproxen Sodium (ALEVE) 220 MG CAPS Take 220 mg by mouth every 8 (eight) hours as needed (pain).   Yes Historical Provider, MD  ondansetron (ZOFRAN ODT) 4 MG disintegrating tablet Take 1 tablet (4 mg total) by mouth every 8 (eight) hours as needed for nausea or vomiting. 02/08/16  Yes Barrett HenleNicole Elizabeth Nadeau, PA-C  oxyCODONE-acetaminophen (PERCOCET/ROXICET) 5-325 MG tablet Take 1 tablet by mouth every 4 (four) hours as needed for severe pain. 02/08/16  Yes Satira SarkNicole Elizabeth Nadeau, PA-C  simethicone (MYLICON) 80 MG chewable tablet Chew 80 mg by mouth every 6 (six) hours as needed for flatulence.   Yes Historical Provider, MD  oxyCODONE (ROXICODONE) 5 MG immediate release tablet Take 2 tablets (10 mg total) by mouth every 6 (six) hours as needed for severe pain. 02/12/16   Iris Hairston, PA-C  sulfamethoxazole-trimethoprim (BACTRIM DS,SEPTRA DS) 800-160 MG tablet Take 1 tablet by mouth 2 (two) times daily. 02/12/16 02/19/16  Dahlia ClientHannah Blen Ransome, PA-C    Family History Family History  Problem Relation Age of Onset  . Diabetes Mother   . Hypertension Father   . Diabetes Father     Social History Social History  Substance Use Topics  . Smoking status: Never Smoker  . Smokeless tobacco: Never  Used  . Alcohol use Yes     Comment: occ     Allergies   Review of patient's allergies indicates no known allergies.   Review of Systems Review of Systems  Gastrointestinal: Positive for abdominal pain.  Genitourinary: Positive for dysuria, flank pain, frequency and urgency.  All other systems reviewed and are negative.    Physical Exam Updated Vital Signs BP 138/88   Pulse 71   Temp 98.8 F (37.1 C) (Oral)   Resp 18   Ht 6\' 1"  (1.854 m)   Wt 88.9 kg   SpO2 (!)  89%   BMI 25.86 kg/m   Physical Exam  Constitutional: He appears well-developed and well-nourished. No distress.  HENT:  Head: Normocephalic and atraumatic.  Mouth/Throat: Oropharynx is clear and moist. No oropharyngeal exudate.  Cardiovascular: Normal rate, regular rhythm, normal heart sounds and intact distal pulses.   Pulmonary/Chest: Effort normal and breath sounds normal. No respiratory distress. He has no wheezes.  Abdominal: Soft. Bowel sounds are normal. He exhibits no distension and no mass. There is tenderness (mild) in the right lower quadrant, periumbilical area and suprapubic area. There is CVA tenderness (right). There is no rigidity, no rebound, no guarding, no tenderness at McBurney's point and negative Murphy's sign. Hernia confirmed negative in the right inguinal area and confirmed negative in the left inguinal area.  Genitourinary: Testes normal and penis normal. Right testis shows no mass, no swelling and no tenderness. Right testis is descended. Cremasteric reflex is not absent on the right side. Left testis shows no mass, no swelling and no tenderness. Left testis is descended. Cremasteric reflex is not absent on the left side. Circumcised. No phimosis, paraphimosis, hypospadias, penile erythema or penile tenderness. No discharge found.  Musculoskeletal: Normal range of motion. He exhibits no edema.  Lymphadenopathy: No inguinal adenopathy noted on the right or left side.  Neurological: He is alert.  Skin: Skin is warm and dry. No rash noted. He is not diaphoretic.  Psychiatric: He has a normal mood and affect.  Nursing note and vitals reviewed.    ED Treatments / Results  Labs (all labs ordered are listed, but only abnormal results are displayed) Labs Reviewed  URINALYSIS, ROUTINE W REFLEX MICROSCOPIC (NOT AT [facility]) - Abnormal; Notable for the following:       Result Value   APPearance CLOUDY (*)    Hgb urine dipstick MODERATE (*)    Ketones, ur 15 (*)    Protein,  ur >300 (*)    Leukocytes, UA SMALL (*)    All other components within normal limits  BASIC METABOLIC PANEL - Abnormal; Notable for the following:    Potassium 3.4 (*)    Glucose, Bld 109 (*)    All other components within normal limits  URINE MICROSCOPIC-ADD ON - Abnormal; Notable for the following:    Squamous Epithelial / LPF 0-5 (*)    Bacteria, UA RARE (*)    All other components within normal limits  URINE CULTURE  CBC  HIV ANTIBODY (ROUTINE TESTING)  GC/CHLAMYDIA PROBE AMP ([place]) NOT [facility]     Procedures Procedures (including critical care time)  Medications Ordered in ED Medications  cefTRIAXone (ROCEPHIN) 1 g in dextrose 5 % 50 mL IVPB (not administered)  azithromycin (ZITHROMAX) tablet 1,000 mg (not administered)  morphine 4 MG/ML injection 4 mg (4 mg Intravenous Given 02/12/16 0143)  sodium chloride 0.9 % bolus 1,000 mL (1,000 mLs Intravenous New Bag/Given 02/12/16 0144)  Initial Impression / Assessment and Plan / ED Course  I have reviewed the triage vital signs and the nursing notes.  Pertinent labs & imaging results that were available during my care of the patient were reviewed by me and considered in my medical decision making (see chart for details).  Clinical Course  Value Comment By Time  Temp: 98.8 F (37.1 C) Afebrile without tachycardia. No hypotension. Dahlia Client [place] Surges, PA-C 08/20 0148  WBC: 9.9 Leukocytosis improving from previous visit Dierdre Forth, PA-C 08/20 0244  Potassium: (!) 3.4 Mild hypokalemia Dierdre Forth, PA-C 08/20 0244  Hgb urine dipstick: (!) MODERATE Moderate hemoglobin, large protein and positive leukocytes.  Nitrite negative however with urinary symptoms and previous CT scan concerning for cystitis and/or prostatitis concern this represents infection.  Rare bacteria. 0-5 white blood cells. Urine culture sent. Dierdre Forth, PA-C 08/20 0244   Patient is feeling much better. Remains without emesis in the  department.  Vital signs remained stable. Dierdre Forth, PA-C 08/20 [phone]    Patient presents with return of his abdominal and flank pain after passing his kidney stone. Previous scan questioned possible cystitis and/or prostatitis. Patient now with urinary symptoms. Urinalysis is not overtly infected however due to symptoms will begin treatment. Urine culture and gonorrhea/chlamydia culture sent. Patient treated with azithromycin and Rocephin here in the emergency department. Will be discharged home with Bactrim to treat for potential prostatitis.  He is well appearing. No evidence of SIRS or sepsis.  At this time and do not believe he needs an additional CT scan. Doubt the presence of abscess.  Will refill patient's oxycodone.  His cost reasons to return immediately to the emergency department including intractable vomiting, worsening pain, rectal pain, painful bowel movements, fevers or other concerns.  Final Clinical Impressions(s) / ED Diagnoses   Final diagnoses:  Right flank pain  Generalized abdominal pain  Concern about STD in male without diagnosis    New Prescriptions New Prescriptions   OXYCODONE (ROXICODONE) 5 MG IMMEDIATE RELEASE TABLET    Take 2 tablets (10 mg total) by mouth every 6 (six) hours as needed for severe pain.   SULFAMETHOXAZOLE-TRIMETHOPRIM (BACTRIM DS,SEPTRA DS) 800-160 MG TABLET    Take 1 tablet by mouth 2 (two) times daily.     Dahlia Client Karita Dralle, PA-C 02/12/16 0320    Jacalyn Lefevre, MD 02/12/16 [phone]

## 2016-02-12 NOTE — ED Triage Notes (Signed)
Pt states he was seen on ED 02/08/2016 for the same complain of flank pain and was diagnose with kidney stones, pt states he passed one kidney stone out, but his pain still there, and his groins are swollen, pt denies any bloody urine, fever, states he still feeling nauseated.

## 2016-02-12 NOTE — Discharge Instructions (Signed)
1. Medications: Bactrim, Percocet, usual home medications 2. Treatment: rest, drink plenty of fluids, advance diet slowly 3. Follow Up: Please followup with [facility] in 3 days for discussion of your diagnoses and further evaluation after today's visit; if you do not have a primary care doctor use the resource guide provided to find one; Please return to the ER for persistent vomiting, high fevers or worsening symptoms

## 2016-02-13 LAB — URINE CULTURE

## 2016-02-13 LAB — GC/CHLAMYDIA PROBE AMP (~~LOC~~) NOT AT ARMC
CHLAMYDIA, DNA PROBE: NEGATIVE
Neisseria Gonorrhea: NEGATIVE

## 2016-06-13 ENCOUNTER — Emergency Department (HOSPITAL_COMMUNITY)
Admission: EM | Admit: 2016-06-13 | Discharge: 2016-06-13 | Disposition: A | Discharge status: Home / Self Care | Payer: Managed Care, Other (non HMO) | Attending: Emergency Medicine | Admitting: Emergency Medicine

## 2016-06-13 ENCOUNTER — Encounter (HOSPITAL_COMMUNITY): Payer: Self-pay | Admitting: Neurology

## 2016-06-13 ENCOUNTER — Emergency Department (HOSPITAL_COMMUNITY): Payer: Managed Care, Other (non HMO)

## 2016-06-13 DIAGNOSIS — K5900 Constipation, unspecified: Secondary | ICD-10-CM | POA: Insufficient documentation

## 2016-06-13 LAB — COMPREHENSIVE METABOLIC PANEL
ALK PHOS: 53 U/L (ref 38–126)
ALT: 19 U/L (ref 17–63)
ANION GAP: 6 (ref 5–15)
AST: 26 U/L (ref 15–41)
Albumin: 4.2 g/dL (ref 3.5–5.0)
BILIRUBIN TOTAL: 0.6 mg/dL (ref 0.3–1.2)
BUN: 9 mg/dL (ref 6–20)
CALCIUM: 9.5 mg/dL (ref 8.9–10.3)
CO2: 25 mmol/L (ref 22–32)
CREATININE: 1.01 mg/dL (ref 0.61–1.24)
Chloride: 106 mmol/L (ref 101–111)
GFR calc non Af Amer: >60 mL/min (ref >60)
GLUCOSE: 94 mg/dL (ref 65–99)
Potassium: 4.2 mmol/L (ref 3.5–5.1)
SODIUM: 137 mmol/L (ref 135–145)
TOTAL PROTEIN: 7.8 g/dL (ref 6.5–8.1)

## 2016-06-13 LAB — URINALYSIS, ROUTINE W REFLEX MICROSCOPIC
Bacteria, UA: NONE SEEN
Bilirubin Urine: NEGATIVE
GLUCOSE, UA: NEGATIVE mg/dL
Ketones, ur: NEGATIVE mg/dL
Leukocytes, UA: NEGATIVE
NITRITE: NEGATIVE
PH: 7 (ref 5.0–8.0)
Protein, ur: NEGATIVE mg/dL
SPECIFIC GRAVITY, URINE: 1.012 (ref 1.005–1.030)
Squamous Epithelial / LPF: NONE SEEN

## 2016-06-13 LAB — CBC
HCT: 41.1 % (ref 39.0–52.0)
HEMOGLOBIN: 13.4 g/dL (ref 13.0–17.0)
MCH: 28.1 pg (ref 26.0–34.0)
MCHC: 32.6 g/dL (ref 30.0–36.0)
MCV: 86.2 fL (ref 78.0–100.0)
PLATELETS: 273 10*3/uL (ref 150–400)
RBC: 4.77 MIL/uL (ref 4.22–5.81)
RDW: 12.5 % (ref 11.5–15.5)
WBC: 6.1 10*3/uL (ref 4.0–10.5)

## 2016-06-13 LAB — LIPASE, BLOOD: Lipase: 18 U/L (ref 11–51)

## 2016-06-13 NOTE — ED Triage Notes (Signed)
Pt reports LUQ abd pain x 5 days intermittently. Pain radiates around to his back. Has had constipation, but took dulcolax and problem solved. Denies n/v/d.

## 2016-06-13 NOTE — Discharge Instructions (Signed)
Take magnesium citrate 150mg  every 12 hours. You should continue to take Senakot. If this you have no relief in the the next 24 hours you should start taking miralax until you have a bowel movement. You may also try a fleet enema which are available over -the counter  Do not hesitate to return to the Emergency Department for any new, worsening or concerning symptoms.

## 2016-06-13 NOTE — ED Provider Notes (Signed)
[facility]-EMERGENCY DEPT Provider Note   CSN: [id number] Arrival date & time: 06/13/16  1633     History   Chief Complaint Chief Complaint  Patient presents with  . Abdominal Pain     HPI   Blood pressure 117/69, pulse 66, temperature 98.6 F (37 C), temperature source Oral, resp. rate 18, height 6\' 1"  (1.854 m), weight 88.5 kg, SpO2 100 %.  Francisco Patterson is a 25 y.o. male complaining of left upper quadrant pain, colicky in mild onset several days ago associated with constipation, he gave himself an enema yesterday and that did relieve some pressure and he had a small, pebble-like bowel movement at that time. He denies fever, chills, nausea, vomiting. No pain medications taken prior to arrival and patient states that this does not feel like prior kidney stone exacerbations.  Past Medical History:  Diagnosis Date  . Mass    left wrist    There are no active problems to display for this patient.   Past Surgical History:  Procedure Laterality Date  . MASS EXCISION Left 12/17/2013   Procedure: LEFT WRIST DEEP MASS EXCISION;  Surgeon: Sharma CovertFred W Ortmann, MD;  [facility];  Service: Orthopedics;  Laterality: Left;       Home Medications    Prior to Admission medications   Medication Sig Start Date End Date Taking? Authorizing Provider  bisacodyl (DULCOLAX) 10 MG suppository Place 10 mg rectally as needed for moderate constipation.   Yes Historical Provider, MD    Family History Family History  Problem Relation Age of Onset  . Diabetes Mother   . Hypertension Father   . Diabetes Father     Social History Social History  Substance Use Topics  . Smoking status: Never Smoker  . Smokeless tobacco: Never Used  . Alcohol use Yes     Comment: occ     Allergies   Patient has no known allergies.   Review of Systems Review of Systems  10 systems reviewed and found to be negative, except as noted in the HPI.   Physical Exam Updated Vital  Signs BP 117/69   Pulse 66   Temp 98.6 F (37 C) (Oral)   Resp 18   Ht 6\' 1"  (1.854 m)   Wt 88.5 kg   SpO2 100%   BMI 25.73 kg/m   Physical Exam  Constitutional: He is oriented to person, place, and time. He appears well-developed and well-nourished. No distress.  HENT:  Head: Normocephalic and atraumatic.  Mouth/Throat: Oropharynx is clear and moist.  Eyes: Conjunctivae and EOM are normal. Pupils are equal, round, and reactive to light.  Neck: Normal range of motion.  Cardiovascular: Normal rate, regular rhythm and intact distal pulses.   Pulmonary/Chest: Effort normal and breath sounds normal.  Abdominal: Soft. He exhibits no distension and no mass. There is no tenderness. There is no rebound and no guarding. No hernia.  Genitourinary:  Genitourinary Comments: No CVA tenderness to percussion bilaterally.  Musculoskeletal: Normal range of motion.  Neurological: He is alert and oriented to person, place, and time.  Skin: Capillary refill takes less than 2 seconds. He is not diaphoretic.  Psychiatric: He has a normal mood and affect.  Nursing note and vitals reviewed.    ED Treatments / Results  Labs (all labs ordered are listed, but only abnormal results are displayed) Labs Reviewed  URINALYSIS, ROUTINE W REFLEX MICROSCOPIC - Abnormal; Notable for the following:       Result Value  Hgb urine dipstick MODERATE (*)    All other components within normal limits  LIPASE, BLOOD  COMPREHENSIVE METABOLIC PANEL  CBC    EKG  EKG Interpretation None       Radiology Dg Abd Acute W/chest  Result Date: 06/13/2016 CLINICAL DATA:  Intermittent left upper quadrant abdominal pain for 5 days. Personal history of kidney stones. EXAM: DG ABDOMEN ACUTE W/ 1V CHEST COMPARISON:  CT of the abdomen and pelvis 02/08/2016. FINDINGS: The heart size normal.  Lungs are clear. Bowel gas pattern is normal. Previously noted bilateral nephrolithiasis is not discretely visualized. Pelvic  phleboliths are evident. No free air is evident. The axial skeleton is within normal limits. IMPRESSION: 1. No acute cardiopulmonary disease. 2. Previously noted nephrolithiasis is not well seen. This may be due to overlying bowel gas. 3. Pelvic phleboliths appear stable. 4. Normal bowel gas pattern. Electronically Signed   By: Marin Robertshristopher  Mattern M.D.   On: 06/13/2016 20:14    Procedures Procedures (including critical care time)  Medications Ordered in ED Medications - No data to display   Initial Impression / Assessment and Plan / ED Course  I have reviewed the triage vital signs and the nursing notes.  Pertinent labs & imaging results that were available during my care of the patient were reviewed by me and considered in my medical decision making (see chart for details).  Clinical Course     Vitals:   06/13/16 1845 06/13/16 2015 06/13/16 2030 06/13/16 2046  BP: 120/68 135/83 117/69   Pulse: 64 71 66   Resp: 18     Temp: 98.3 F (36.8 C)   98.6 F (37 C)  TempSrc: Oral   Oral  SpO2: 100% 100% 100%   Weight:      Height:        Francisco Patterson is 25 y.o. male presenting with Mild colicky left upper quadrant abdominal pain with associated diarrhea. Abdominal exam is nonsurgical, patient is quite comfortable, I doubt this is a stone. Vital signs reassuring. Acute abdominal series with normal bowel gas pattern. Advised patient to push fluids, eat fresh fruits and vegetables and continued to remain physically active. dDx:  Evaluation does not show pathology that would require ongoing emergent intervention or inpatient treatment. Pt is hemodynamically stable and mentating appropriately. Discussed findings and plan with patient/guardian, who agrees with care plan. All questions answered. Return precautions discussed and outpatient follow up given.      Final Clinical Impressions(s) / ED Diagnoses   Final diagnoses:  Constipation, unspecified constipation type    New  Prescriptions New Prescriptions   No medications on file     Wynetta Emeryicole Chey Rachels, PA-C 06/13/16 2052    Loren Raceravid Yelverton, MD 06/14/16 [phone]

## 2017-06-17 ENCOUNTER — Encounter (HOSPITAL_COMMUNITY): Payer: Self-pay

## 2017-06-17 ENCOUNTER — Other Ambulatory Visit: Payer: Self-pay

## 2017-06-17 DIAGNOSIS — K0889 Other specified disorders of teeth and supporting structures: Secondary | ICD-10-CM | POA: Insufficient documentation

## 2017-06-17 NOTE — ED Triage Notes (Signed)
Pt here for dental pain onset 2-3 weeks when he had a broken tooth, has not made an appt with dentist. Attempted to OTC meds with no relief.

## 2017-06-18 ENCOUNTER — Emergency Department (HOSPITAL_COMMUNITY)
Admission: EM | Admit: 2017-06-18 | Discharge: 2017-06-18 | Disposition: A | Discharge status: Home / Self Care | Payer: Managed Care, Other (non HMO) | Attending: Emergency Medicine | Admitting: Emergency Medicine

## 2017-06-18 ENCOUNTER — Encounter (HOSPITAL_COMMUNITY): Payer: Self-pay | Admitting: Emergency Medicine

## 2017-06-18 ENCOUNTER — Emergency Department (HOSPITAL_COMMUNITY): Payer: Self-pay

## 2017-06-18 ENCOUNTER — Emergency Department (HOSPITAL_COMMUNITY)
Admission: EM | Admit: 2017-06-18 | Discharge: 2017-06-18 | Disposition: A | Discharge status: Home / Self Care | Payer: Self-pay | Attending: Emergency Medicine | Admitting: Emergency Medicine

## 2017-06-18 ENCOUNTER — Other Ambulatory Visit: Payer: Self-pay

## 2017-06-18 DIAGNOSIS — M272 Inflammatory conditions of jaws: Secondary | ICD-10-CM | POA: Insufficient documentation

## 2017-06-18 DIAGNOSIS — K0889 Other specified disorders of teeth and supporting structures: Secondary | ICD-10-CM

## 2017-06-18 LAB — I-STAT CHEM 8, ED
BUN: 5 mg/dL — AB (ref 6–20)
CALCIUM ION: 1.19 mmol/L (ref 1.15–1.40)
CREATININE: 0.7 mg/dL (ref 0.61–1.24)
Chloride: 101 mmol/L (ref 101–111)
Glucose, Bld: 105 mg/dL — ABNORMAL HIGH (ref 65–99)
HCT: 47 % (ref 39.0–52.0)
HEMOGLOBIN: 16 g/dL (ref 13.0–17.0)
Potassium: 4.1 mmol/L (ref 3.5–5.1)
Sodium: 139 mmol/L (ref 135–145)
TCO2: 25 mmol/L (ref 22–32)

## 2017-06-18 LAB — CBC WITH DIFFERENTIAL/PLATELET
Basophils Absolute: 0 10*3/uL (ref 0.0–0.1)
Basophils Relative: 0 %
Eosinophils Absolute: 0 10*3/uL (ref 0.0–0.7)
Eosinophils Relative: 0 %
HCT: 43.8 % (ref 39.0–52.0)
Hemoglobin: 14.6 g/dL (ref 13.0–17.0)
Lymphocytes Relative: 6 %
Lymphs Abs: 0.9 10*3/uL (ref 0.7–4.0)
MCH: 29.3 pg (ref 26.0–34.0)
MCHC: 33.3 g/dL (ref 30.0–36.0)
MCV: 87.8 fL (ref 78.0–100.0)
Monocytes Absolute: 0.9 10*3/uL (ref 0.1–1.0)
Monocytes Relative: 6 %
Neutro Abs: 13.2 10*3/uL — ABNORMAL HIGH (ref 1.7–7.7)
Neutrophils Relative %: 88 %
Platelets: 239 10*3/uL (ref 150–400)
RBC: 4.99 MIL/uL (ref 4.22–5.81)
RDW: 12.9 % (ref 11.5–15.5)
WBC: 15.1 10*3/uL — ABNORMAL HIGH (ref 4.0–10.5)

## 2017-06-18 MED ORDER — CLINDAMYCIN HCL 150 MG PO CAPS: 450.0000 mg | ORAL_CAPSULE | Freq: Three times a day (TID) | ORAL | 0 refills | Status: AC

## 2017-06-18 MED ORDER — PENICILLIN V POTASSIUM 500 MG PO TABS: 500.0000 mg | ORAL_TABLET | Freq: Four times a day (QID) | ORAL | 0 refills | Status: AC

## 2017-06-18 MED ORDER — IOPAMIDOL (ISOVUE-300) INJECTION 61%
INTRAVENOUS | Status: AC
Start: 2017-06-18 — End: 2017-06-18
  Administered 2017-06-18: 75 mL
  Filled 2017-06-18: qty 75

## 2017-06-18 MED ORDER — PENICILLIN V POTASSIUM 250 MG PO TABS
500.0000 mg | ORAL_TABLET | Freq: Once | ORAL | Status: AC
  Administered 2017-06-18: 500 mg via ORAL
  Filled 2017-06-18: qty 2

## 2017-06-18 MED ORDER — ACETAMINOPHEN 500 MG PO TABS
1000.0000 mg | ORAL_TABLET | Freq: Once | ORAL | Status: AC
  Administered 2017-06-18: 1000 mg via ORAL
  Filled 2017-06-18: qty 2

## 2017-06-18 MED ORDER — FENTANYL CITRATE (PF) 100 MCG/2ML IJ SOLN
50.0000 ug | Freq: Once | INTRAMUSCULAR | Status: AC
Start: 2017-06-18 — End: 2017-06-18
  Administered 2017-06-18: 50 ug via INTRAVENOUS
  Filled 2017-06-18: qty 2

## 2017-06-18 MED ORDER — DEXAMETHASONE SODIUM PHOSPHATE 10 MG/ML IJ SOLN
10.0000 mg | Freq: Once | INTRAMUSCULAR | Status: AC
  Administered 2017-06-18: 10 mg via INTRAVENOUS
  Filled 2017-06-18: qty 1

## 2017-06-18 NOTE — Discharge Instructions (Signed)
Please take all of your antibiotics until finished!   You may develop abdominal discomfort or diarrhea from the antibiotic.  You may help offset this with probiotics which you can buy or get in yogurt. Do not eat or take the probiotics until 2 hours after your antibiotic.   Apply warm compresses or ice packs to jaw throughout the day. Alternate 600 mg of ibuprofen and [phone] mg of Tylenol every 3 hours as needed for pain. Do not exceed 4000 mg of Tylenol daily.  You may also use warm water salt gargles, Orajel, or other over-the-counter dental pain remedies. Followup with a dentist is very important for ongoing evaluation and management of recurrent dental pain. Call your dentist after the holidays to set up follow up appointment. Return to emergency department for emergent changing or worsening symptoms such as fever, worsening facial swelling, difficulty breathing or swallowing, throat tightness, or vision changes.

## 2017-06-18 NOTE — ED Notes (Signed)
Patient transported to CT 

## 2017-06-18 NOTE — ED Notes (Signed)
I-Stat Chem 8 is being redrawn due to elevated K.

## 2017-06-18 NOTE — ED Notes (Signed)
Redrawing blood due to hemolyzation of first

## 2017-06-18 NOTE — ED Provider Notes (Signed)
[facility] EMERGENCY DEPARTMENT Provider Note   CSN: [id number] Arrival date & time: 06/18/17  1243     History   Chief Complaint Chief Complaint  Patient presents with  . Dental Pain    HPI Francisco Patterson is a 26 y.o. male who presents to the emergency department with a chief complaint of facial swelling.  The patient was evaluated approximately 16 hours ago in the ED for dental pain and was discharged home with penicillin, acetaminophen, and dental follow-up.  The patient reports that his aunt is a Education officer, community, and he is scheduled for follow-up with their office reopens in 5 days.  He reports that since he was discharged last night that he developed sudden onset, rapidly worsening swelling to the right jaw that appeared after he was discharged from the emergency department.  He has taken 2 doses of the penicillin since discharge.  He does not feel as if his throat is closing.  He is not having any difficulty breathing.  No difficulty swallowing, but reports that he has been unable to eat due to the pain.  He is treated his symptoms with Tylenol and ibuprofen with no improvement.  He denies fever, chills, neck pain or stiffness.   The history is provided by the patient. No language interpreter was used.    Past Medical History:  Diagnosis Date  . Mass    left wrist    There are no active problems to display for this patient.   Past Surgical History:  Procedure Laterality Date  . MASS EXCISION Left 12/17/2013   Procedure: LEFT WRIST DEEP MASS EXCISION;  Surgeon: Sharma Covert, MD;  Location: [facility];  Service: Orthopedics;  Laterality: Left;       Home Medications    Prior to Admission medications   Medication Sig Start Date End Date Taking? Authorizing Provider  penicillin v potassium (VEETID) 500 MG tablet Take 1 tablet (500 mg total) by mouth 4 (four) times daily for 7 days. 06/18/17 06/25/17 Yes Fawze, Mina A, PA-C  clindamycin  (CLEOCIN) 150 MG capsule Take 3 capsules (450 mg total) by mouth 3 (three) times daily for 7 days. 06/18/17 06/25/17  McDonald, Coral Else, PA-C    Family History Family History  Problem Relation Age of Onset  . Diabetes Mother   . Hypertension Father   . Diabetes Father     Social History Social History   Tobacco Use  . Smoking status: Never Smoker  . Smokeless tobacco: Never Used  Substance Use Topics  . Alcohol use: Yes    Comment: occ  . Drug use: No     Allergies   Patient has no known allergies.   Review of Systems Review of Systems  Constitutional: Negative for chills and fever.  HENT: Positive for dental problem, facial swelling and trouble swallowing. Negative for mouth sores and sore throat.   Musculoskeletal: Negative for neck pain.  Allergic/Immunologic: Negative for immunocompromised state.     Physical Exam Updated Vital Signs BP 127/78 (BP Location: Right Arm)   Pulse 73 Comment: Simultaneous filing. User may not have seen previous data.  Temp 98 F (36.7 C) (Oral)   Resp 16   Ht 6\' 1"  (1.854 m)   Wt 84.8 kg (187 lb)   SpO2 99% Comment: Simultaneous filing. User may not have seen previous data.  BMI 24.67 kg/m   Physical Exam  Constitutional: He appears well-developed and well-nourished.  HENT:  Head: Normocephalic and atraumatic.  Mouth/Throat: Oropharynx is clear and moist and mucous membranes are normal. Mucous membranes are not pale, not dry and not cyanotic. No oropharyngeal exudate, posterior oropharyngeal edema, posterior oropharyngeal erythema or tonsillar abscesses. No tonsillar exudate.  Posterior oropharynx is unremarkable. No trismus or sublingual abnormalities. TTP over the right, maxillary first molar with exposed dentin and right lower canine.  No observable abscess or swelling noted.  No drainage noted.  No evidence of gingival irritation.   Eyes: Conjunctivae are normal.  Neck: Normal range of motion. Neck supple.  No induration  or fluctuance noted to the neck.  No meningeal signs.  Full active and passive range of motion.  Cardiovascular: Normal rate and regular rhythm.  No murmur heard. Pulmonary/Chest: Effort normal and breath sounds normal. No respiratory distress.  Abdominal: Soft. There is no tenderness.  Musculoskeletal: He exhibits no edema.  Lymphadenopathy:    He has cervical adenopathy.  Neurological: He is alert.  Skin: Skin is warm and dry.  Psychiatric: He has a normal mood and affect.  Nursing note and vitals reviewed.    ED Treatments / Results  Labs (all labs ordered are listed, but only abnormal results are displayed) Labs Reviewed  CBC WITH DIFFERENTIAL/PLATELET - Abnormal; Notable for the following components:      Result Value   WBC 15.1 (*)    Neutro Abs 13.2 (*)    All other components within normal limits  I-STAT CHEM 8, ED - Abnormal; Notable for the following components:   Potassium 8.5 (*)    Glucose, Bld 104 (*)    Calcium, Ion 1.05 (*)    All other components within normal limits  I-STAT CHEM 8, ED - Abnormal; Notable for the following components:   BUN 5 (*)    Glucose, Bld 105 (*)    All other components within normal limits    EKG  EKG Interpretation None       Radiology Ct Soft Tissue Neck W Contrast  Result Date: 06/18/2017 CLINICAL DATA:  Increased right-sided facial swelling. EXAM: CT NECK WITH CONTRAST TECHNIQUE: Multidetector CT imaging of the neck was performed using the standard protocol following the bolus administration of intravenous contrast. CONTRAST:  75mL ISOVUE-300 IOPAMIDOL (ISOVUE-300) INJECTION 61% COMPARISON:  None. FINDINGS: Pharynx and larynx: Negative for inflammation or masslike finding. Salivary glands: Negative Thyroid: Negative Lymph nodes: Mild reactive nodal enlargement in the submental space. Vascular: Negative Limited intracranial: Negative Visualized orbits: Normal where seen Mastoids and visualized paranasal sinuses: Clear where  seen. Skeleton: Periapical erosion about the root of tooth 30. There is soft tissue inflammation in the right face that is centered on this tooth. No discrete subperiosteal abscess. No floor of mouth inflammation. Upper chest: Negative IMPRESSION: Odontogenic infection in the right face associated with tooth 30. No abscess or floor of mouth inflammation. Electronically Signed   By: Marnee SpringJonathon  Watts M.D.   On: 06/18/2017 17:50    Procedures Procedures (including critical care time)  Medications Ordered in ED Medications  dexamethasone (DECADRON) injection 10 mg (10 mg Intravenous Given 06/18/17 1550)  fentaNYL (SUBLIMAZE) injection 50 mcg (50 mcg Intravenous Given 06/18/17 1550)  iopamidol (ISOVUE-300) 61 % injection (75 mLs  Contrast Given 06/18/17 1721)     Initial Impression / Assessment and Plan / ED Course  I have reviewed the triage vital signs and the nursing notes.  Pertinent labs & imaging results that were available during my care of the patient were reviewed by me and considered in my medical decision making (  see chart for details).     26 year old male with history of dental pain who was evaluated in the ED less than 24 hours ago, presenting with rapidly worsening right jaw swelling.  No trismus, respiratory distress, muffled voice, or tripodding.  No constitutional symptoms at this time. i-STAT Chem-8 initially hemolyzed and erroneously showed a potassium of 8.5.  On nonhemolyzed i-STAT Chem-8, electro lites are unremarkable.  Leukocytosis of 15, with an elevated absolute neutrophil count of 13.  CT with soft tissue inflammation in the right face stemming from tooth 30.  No sublingual inflammation or subperiosteal abscess.  Discussed these findings with the patient.  Will switch his antibiotic from penicillin to clindamycin for better coverage of soft tissue infection.  Decadron given in the ED.  Pain controlled in the ED.  He is able to eat and drink.  Urged the patient to follow-up  with a dentist or oral surgeon and have provided him with a resource guide as well as follow-up.  Strict return precautions given.  No acute distress.  Patient is safe for discharge at this time.  Final Clinical Impressions(s) / ED Diagnoses   Final diagnoses:  Odontogenic infection of jaw    ED Discharge Orders        Ordered    clindamycin (CLEOCIN) 150 MG capsule  3 times daily     06/18/17 1810       McDonald, Coral ElseMia A, PA-C 06/18/17 1837    Shaune PollackIsaacs, Cameron, MD 06/19/17 [phone]

## 2017-06-18 NOTE — ED Triage Notes (Signed)
To ED with c/o increased facial swelling-- was seen here last night for same, rx for PCN -- has taken 2 doses so far. Concerned that the swelling has gotten bigger-- unable to eat.

## 2017-06-18 NOTE — ED Provider Notes (Signed)
[facility] Provider Note   CSN: [id number] Arrival date & time: 06/17/17  2319     History   Chief Complaint Chief Complaint  Patient presents with  . Dental Pain    HPI Francisco Patterson is a 26 y.o. male who presents today with chief complaint acute onset, constant right lower dental pain which began yesterday.  He states that approximately 2-3 weeks ago he broke a tooth in his right lower jaw but did not have any pain until yesterday.  Pain radiates up to the right side of the face and worsens with chewing.  He notes sensitivity to air but no significant sensitivity to hot or cold foods.  Pain is described as a throbbing aching sensation.  He denies fevers, chills, difficulty swallowing or breathing, ear pain, nasal congestion, or sore throat.  He has tried ibuprofen and Advil without significant relief of his symptoms.  He has applied vanilla extract to the tooth with mild temporary relief.  He has a Education officer, communitydentist but has been unable to set up a follow-up appointment with her yet.  The history is provided by the patient.    Past Medical History:  Diagnosis Date  . Mass    left wrist    There are no active problems to display for this patient.   Past Surgical History:  Procedure Laterality Date  . MASS EXCISION Left 12/17/2013   Procedure: LEFT WRIST DEEP MASS EXCISION;  Surgeon: Sharma CovertFred W Ortmann, MD;  [facility];  Service: Orthopedics;  Laterality: Left;       Home Medications    Prior to Admission medications   Medication Sig Start Date End Date Taking? Authorizing Provider  bisacodyl (DULCOLAX) 10 MG suppository Place 10 mg rectally as needed for moderate constipation.    [provider]  penicillin v potassium (VEETID) 500 MG tablet Take 1 tablet (500 mg total) by mouth 4 (four) times daily for 7 days. 06/18/17 06/25/17  Jeanie SewerFawze, Halena Mohar A, PA-C    Family History Family History  Problem Relation Age of  Onset  . Diabetes Mother   . Hypertension Father   . Diabetes Father     Social History Social History   Tobacco Use  . Smoking status: Never Smoker  . Smokeless tobacco: Never Used  Substance Use Topics  . Alcohol use: Yes    Comment: occ  . Drug use: No     Allergies   Patient has no known allergies.   Review of Systems Review of Systems  Constitutional: Negative for chills and fever.  HENT: Positive for dental problem. Negative for drooling, ear pain and trouble swallowing.   Eyes: Negative for visual disturbance.  Respiratory: Negative for shortness of breath.   Cardiovascular: Negative for chest pain.  Gastrointestinal: Negative for abdominal pain, nausea and vomiting.     Physical Exam Updated Vital Signs BP 124/83 (BP Location: Right Arm)   Pulse 68   Temp 98.7 F (37.1 C) (Oral)   Resp 18   SpO2 100%   Physical Exam  Constitutional: He appears well-developed and well-nourished. No distress.  HENT:  Head: Normocephalic and atraumatic.  Mouth/Throat: Uvula is midline, oropharynx is clear and moist and mucous membranes are normal. No oral lesions. No trismus in the jaw. Abnormal dentition. Dental caries present. No lacerations. No oropharyngeal exudate, posterior oropharyngeal edema, posterior oropharyngeal erythema or tonsillar abscesses.    No frontal or maxillary sinus TTP.  TMs without erythema or bulging  bilaterally.  Posterior oropharynx is unremarkable with no erythema, tonsillar hypertrophy, uvular deviation, or exudates.  No trismus or sublingual abnormalities.  He has moderately decaying dentition diffusely with cracked right lower first molar.  Dentin is exposed.  He has tenderness to percussion overlying this area and the right lower canine.  No observable abscess or swelling noted.  No drainage noted.  No evidence of gingival irritation.  Eyes: Conjunctivae and EOM are normal. Pupils are equal, round, and reactive to light. Right eye exhibits no  discharge. Left eye exhibits no discharge.  Neck: Normal range of motion. Neck supple. No JVD present. No tracheal deviation present.  Cardiovascular: Normal rate.  Pulmonary/Chest: Effort normal.  Abdominal: He exhibits no distension.  Musculoskeletal: He exhibits no edema.  Lymphadenopathy:    He has no cervical adenopathy.  Neurological: He is alert.  Skin: Skin is warm and dry. No erythema.  Psychiatric: He has a normal mood and affect. His behavior is normal.  Nursing note and vitals reviewed.    ED Treatments / Results  Labs (all labs ordered are listed, but only abnormal results are displayed) Labs Reviewed - No data to display  EKG  EKG Interpretation None       Radiology No results found.  Procedures Procedures (including critical care time)  Medications Ordered in ED Medications  penicillin v potassium (VEETID) tablet 500 mg (500 mg Oral Given 06/18/17 0050)  acetaminophen (TYLENOL) tablet 1,000 mg (1,000 mg Oral Given 06/18/17 0050)     Initial Impression / Assessment and Plan / ED Course  I have reviewed the triage vital signs and the nursing notes.  Pertinent labs & imaging results that were available during my care of the patient were reviewed by me and considered in my medical decision making (see chart for details).     Patient with toothache from a cracked tooth.  No gross abscess.  Afebrile, vital signs are stable.  He is nontoxic in appearance.  Exam unconcerning for Ludwig's angina or spread of infection.  No fever or meningeal signs to suggest meningitis.  No evidence of airway compromise and he is tolerating secretions without difficulty.  Will treat with penicillin, NSAIDs, and Tylenol.  Urged patient to follow-up with dentist.  He will call to schedule follow-up with her after the Christmas holiday.  Discussed indications for return to the ED. Pt verbalized understanding of and agreement with plan and is safe for discharge home at this time.  He  has no complaints prior to discharge.   Final Clinical Impressions(s) / ED Diagnoses   Final diagnoses:  Pain, dental    ED Discharge Orders        Ordered    penicillin v potassium (VEETID) 500 MG tablet  4 times daily     06/18/17 0031       Jeanie SewerFawze, [place] A, PA-C 06/18/17 0055    Ward, Layla MawKristen N, DO 06/18/17 [phone]

## 2017-06-18 NOTE — Discharge Instructions (Signed)
Take 3 tablets of clindamycin every 8 hours for the next 7 days.  Stop taking the penicillin.  Apply a cool compress to the cheek to help with swelling.  Avoid applying a warm compress because it can actually make swelling worse.  Take 800 mg of ibuprofen with food or thousand milligrams of Tylenol every 8 hours for pain control.  Please call to be seen by dentist or 1 of our oral surgeon as soon as possible.  If you develop new or worsening symptoms, including feelings of your throat is closing, difficulty breathing, the inability to open your mouth, please return to the emergency department for reevaluation.

## 2017-06-19 LAB — I-STAT CHEM 8, ED
BUN: 7 mg/dL (ref 6–20)
CALCIUM ION: 1.05 mmol/L — AB (ref 1.15–1.40)
CHLORIDE: 104 mmol/L (ref 101–111)
Creatinine, Ser: 0.7 mg/dL (ref 0.61–1.24)
GLUCOSE: 104 mg/dL — AB (ref 65–99)
HCT: 47 % (ref 39.0–52.0)
Hemoglobin: 16 g/dL (ref 13.0–17.0)
Potassium: 8.5 mmol/L (ref 3.5–5.1)
SODIUM: 135 mmol/L (ref 135–145)
TCO2: 27 mmol/L (ref 22–32)

## 2017-06-20 ENCOUNTER — Encounter (HOSPITAL_COMMUNITY): Payer: Self-pay | Admitting: *Deleted

## 2017-06-20 ENCOUNTER — Emergency Department (HOSPITAL_COMMUNITY)
Admission: EM | Admit: 2017-06-20 | Discharge: 2017-06-20 | Disposition: A | Discharge status: Home / Self Care | Payer: Self-pay | Attending: Emergency Medicine | Admitting: Emergency Medicine

## 2017-06-20 ENCOUNTER — Other Ambulatory Visit: Payer: Self-pay

## 2017-06-20 DIAGNOSIS — R600 Localized edema: Secondary | ICD-10-CM | POA: Insufficient documentation

## 2017-06-20 DIAGNOSIS — K047 Periapical abscess without sinus: Secondary | ICD-10-CM

## 2017-06-20 MED ORDER — DEXAMETHASONE SODIUM PHOSPHATE 10 MG/ML IJ SOLN
10.0000 mg | Freq: Once | INTRAMUSCULAR | Status: AC
  Administered 2017-06-20: 10 mg via INTRAMUSCULAR
  Filled 2017-06-20: qty 1

## 2017-06-20 MED ORDER — IBUPROFEN 800 MG PO TABS: 800.0000 mg | ORAL_TABLET | Freq: Three times a day (TID) | ORAL | 0 refills | Status: DC

## 2017-06-20 NOTE — Discharge Instructions (Signed)
Please alternate between tylenol and ibuprofen as needed for pain.  Take antibiotic as prescribed for the full duration.  Follow up closely with your dentist for further care.  Return if you have trouble swallowing, neck pain or if you have other concerns.

## 2017-06-20 NOTE — ED Triage Notes (Signed)
Pt states R facial swelling since Dec 25th.  Has been taking antibiotics as prescribed, but the swelling and pain continues to increase.

## 2017-06-20 NOTE — ED Provider Notes (Signed)
[facility] Provider Note   CSN: [id number] Arrival date & time: 06/20/17  1208     History   Chief Complaint Chief Complaint  Patient presents with  . Dental Pain    HPI Francisco Patterson is a 26 y.o. male.  HPI  26 year old male presenting for evaluation of dental pain.  This is his third ER visit for the same complaint.  For the past 3-4 days patient has had pain and swelling involving his right lower jaw.  Pain is described as an achy pressure sensation with worsening swelling.  There is no associated fever, chills, difficulty swallowing or difficulties breathing.  Patient did receive antibiotic including clindamycin, Tylenol and dental follow-up.  He also had a CT scan showed no evidence of abscess.  In short, patient initially was seen for his dental pain and was given penicillin.  He took it for 2 days with no improvement.  He was seen again and was switched over to clindamycin.  He has been taking that medication for 2 days and now noticed that the swelling is tracking towards his lower jaw with increased swelling and throbbing pain.  Pain is mildly improved with taking Tylenol that was prescribed.  No report of fever, throat swelling, trouble swallowing or trouble breathing.  His dental appointment is January 5.  His primary concern is lack of improvement of his swallowing despite being on antibiotic.  Past Medical History:  Diagnosis Date  . Mass    left wrist    There are no active problems to display for this patient.   Past Surgical History:  Procedure Laterality Date  . MASS EXCISION Left 12/17/2013   Procedure: LEFT WRIST DEEP MASS EXCISION;  Surgeon: Sharma CovertFred W Ortmann, MD;  [facility] [place];  Service: Orthopedics;  Laterality: Left;       Home Medications    Prior to Admission medications   Medication Sig Start Date End Date Taking? Authorizing Provider  clindamycin (CLEOCIN) 150 MG capsule Take 3  capsules (450 mg total) by mouth 3 (three) times daily for 7 days. 06/18/17 06/25/17  McDonald, Mia A, PA-C  penicillin v potassium (VEETID) 500 MG tablet Take 1 tablet (500 mg total) by mouth 4 (four) times daily for 7 days. 06/18/17 06/25/17  Jeanie SewerFawze, Mina A, PA-C    Family History Family History  Problem Relation Age of Onset  . Diabetes Mother   . Hypertension Father   . Diabetes Father     Social History Social History   Tobacco Use  . Smoking status: Never Smoker  . Smokeless tobacco: Never Used  Substance Use Topics  . Alcohol use: Yes    Comment: occ  . Drug use: No     Allergies   Patient has no known allergies.   Review of Systems Review of Systems  Constitutional: Negative for fever.  HENT: Positive for dental problem and facial swelling. Negative for drooling, trouble swallowing and voice change.   Respiratory: Negative for shortness of breath.   Cardiovascular: Negative for chest pain.  Musculoskeletal: Negative for neck pain.  Neurological: Negative for headaches.     Physical Exam Updated Vital Signs BP 115/83 (BP Location: Right Arm)   Pulse 91   Temp 98.7 F (37.1 C) (Oral)   Resp 17   Ht 6\' 1"  (1.854 m)   Wt 84.8 kg (187 lb)   SpO2 100%   BMI 24.67 kg/m   Physical Exam  Constitutional: He appears well-developed  and well-nourished. No distress.  Patient is well-appearing, nontoxic  HENT:  Head: Atraumatic.  Dental decay noted to tooth #30 with mild tenderness to palpation.  Adjacent facial swelling around tooth #28 that is tender to palpation with mild surrounding erythema.  No trismus.  No tongue swelling, no posterior oropharyngeal erythema or edema.  Eyes: Conjunctivae are normal.  Neck: Neck supple.  Cardiovascular: Normal rate and regular rhythm.  Pulmonary/Chest: Effort normal and breath sounds normal.  Lymphadenopathy:    He has cervical adenopathy.  Neurological: He is alert.  Skin: No rash noted.  Psychiatric: He has a normal mood  and affect.  Nursing note and vitals reviewed.    ED Treatments / Results  Labs (all labs ordered are listed, but only abnormal results are displayed) Labs Reviewed - No data to display  EKG  EKG Interpretation None       Radiology Ct Soft Tissue Neck W Contrast  Result Date: 06/18/2017 CLINICAL DATA:  Increased right-sided facial swelling. EXAM: CT NECK WITH CONTRAST TECHNIQUE: Multidetector CT imaging of the neck was performed using the standard protocol following the bolus administration of intravenous contrast. CONTRAST:  75mL ISOVUE-300 IOPAMIDOL (ISOVUE-300) INJECTION 61% COMPARISON:  None. FINDINGS: Pharynx and larynx: Negative for inflammation or masslike finding. Salivary glands: Negative Thyroid: Negative Lymph nodes: Mild reactive nodal enlargement in the submental space. Vascular: Negative Limited intracranial: Negative Visualized orbits: Normal where seen Mastoids and visualized paranasal sinuses: Clear where seen. Skeleton: Periapical erosion about the root of tooth 30. There is soft tissue inflammation in the right face that is centered on this tooth. No discrete subperiosteal abscess. No floor of mouth inflammation. Upper chest: Negative IMPRESSION: Odontogenic infection in the right face associated with tooth 30. No abscess or floor of mouth inflammation. Electronically Signed   By: Marnee SpringJonathon  Watts M.D.   On: 06/18/2017 17:50    Procedures Procedures (including critical care time)  Medications Ordered in ED Medications - No data to display   Initial Impression / Assessment and Plan / ED Course  I have reviewed the triage vital signs and the nursing notes.  Pertinent labs & imaging results that were available during my care of the patient were reviewed by me and considered in my medical decision making (see chart for details).       Final Clinical Impressions(s) / ED Diagnoses   Final diagnoses:  Periapical abscess with facial involvement    ED Discharge  Orders        Ordered    ibuprofen (ADVIL,MOTRIN) 800 MG tablet  3 times daily     06/20/17 1342     1:39 PM Patient with dental pain facial swelling consistent with periapical abscess with facial involvement.  No systemic changes.  Patient is well-appearing.  No evidence of airway obstruction.  No evidence concerning for Lugwig angina or other deep tissue infection.  Reassurance given, recommend continue with antibiotic as prescribed.  A course of steroid given here to help with the swelling.  Recommend patient to alternate between Tylenol and ibuprofen for pain and will need to follow-up with dentist as previously scheduled.  Return precautions discussed.  Patient voiced understanding and agrees with plan.    Fayrene Helperran, Neco Kling, PA-C 06/20/17 1342    Loren RacerYelverton, David, MD 06/22/17 [phone]

## 2017-06-22 ENCOUNTER — Encounter (HOSPITAL_COMMUNITY): Payer: Self-pay

## 2017-06-22 ENCOUNTER — Emergency Department (HOSPITAL_COMMUNITY)
Admission: EM | Admit: 2017-06-22 | Discharge: 2017-06-22 | Disposition: A | Discharge status: Home / Self Care | Payer: Self-pay | Attending: Physician Assistant | Admitting: Physician Assistant

## 2017-06-22 DIAGNOSIS — K029 Dental caries, unspecified: Secondary | ICD-10-CM | POA: Insufficient documentation

## 2017-06-22 DIAGNOSIS — K047 Periapical abscess without sinus: Secondary | ICD-10-CM | POA: Insufficient documentation

## 2017-06-22 MED ORDER — BUTAMBEN-TETRACAINE-BENZOCAINE 2-2-14 % EX AERO
1.0000 | INHALATION_SPRAY | Freq: Once | CUTANEOUS | Status: AC
  Administered 2017-06-22: 1 via TOPICAL

## 2017-06-22 MED ORDER — BENZOCAINE (TOPICAL) 20 % EX AERO
INHALATION_SPRAY | Freq: Once | CUTANEOUS | Status: DC
  Filled 2017-06-22: qty 57

## 2017-06-22 NOTE — Discharge Instructions (Signed)
Apply warm compresses to jaw throughout the day. Take your home antibiotic until finished. Alternate between tylenol and motrin as needed for pain. Perform salt water swishes to help with pain/swelling. Use over the counter oragel as needed for additional relief. Call the oral surgeon listed above today to follow up on today's visit, and for ongoing management of your dental issue. Return to emergency department for emergent changing or worsening symptoms.

## 2017-06-22 NOTE — ED Provider Notes (Signed)
[facility] Provider Note   CSN: [id number] Arrival date & time: 06/22/17  0815     History   Chief Complaint Chief Complaint  Patient presents with  . Dental Pain    HPI Francisco Patterson is a 26 y.o. male who presents to the ED with complaints of ongoing dental pain and facial swelling x4 days. Per chart review pt was seen in the ED 3 times before today, for the same complaints. Initially was seen early on 06/18/17, sent home with PCN VK; then seen again later that evening, had labs which showed WBC 15.1, was given decadron, and had a CT neck soft tissue that showed odontogenic infection of the R face coming from Tooth #30, but no abscess was identified; he was sent home with clindamycin. He was again seen on 06/20/17 and was given ibuprofen and advised to continue taking clindamycin.  He states that since then, his swelling continues to worsen, and he continues to have pain.  He has been taking the clindamycin as directed and has not missed any doses.  He describes his pain today is 7/10 constant throbbing nonradiating right lower dental pain, worse with eating and palpating the area, and unrelieved with ibuprofen, Tylenol, and ice.  He states that he has a area of swelling along his gumline which is new.  He denies any gum drainage, drooling, trismus, fevers, chills, CP, SOB, abd pain, N/V/D/C, hematuria, dysuria, myalgias, arthralgias, numbness, tingling, focal weakness, or any other complaints at this time.  He is a non-smoker.  He has a Education officer, communitydentist, Dr. Romie LeveeStacy Green, but their office is not open until next week.  He attempted calling the oral surgeon that was provided to him at his last ED visit, but they are also not open until next week.   The history is provided by the patient and medical records. No language interpreter was used.  Dental Pain   This is a new problem. The current episode started more than 2 days ago. The problem occurs constantly. The  problem has been gradually worsening. The pain is at a severity of 7/10. The pain is moderate. He has tried acetaminophen and ice (and ibuprofen) for the symptoms. The treatment provided no relief.    Past Medical History:  Diagnosis Date  . Mass    left wrist    There are no active problems to display for this patient.   Past Surgical History:  Procedure Laterality Date  . MASS EXCISION Left 12/17/2013   Procedure: LEFT WRIST DEEP MASS EXCISION;  Surgeon: Sharma CovertFred W Ortmann, MD;  [facility] [place];  Service: Orthopedics;  Laterality: Left;       Home Medications    Prior to Admission medications   Medication Sig Start Date End Date Taking? Authorizing Provider  clindamycin (CLEOCIN) 150 MG capsule Take 3 capsules (450 mg total) by mouth 3 (three) times daily for 7 days. 06/18/17 06/25/17  McDonald, Mia A, PA-C  ibuprofen (ADVIL,MOTRIN) 800 MG tablet Take 1 tablet (800 mg total) by mouth 3 (three) times daily. 06/20/17   Fayrene Helperran, [place], PA-C  penicillin v potassium (VEETID) 500 MG tablet Take 1 tablet (500 mg total) by mouth 4 (four) times daily for 7 days. 06/18/17 06/25/17  Jeanie SewerFawze, Mina A, PA-C    Family History Family History  Problem Relation Age of Onset  . Diabetes Mother   . Hypertension Father   . Diabetes Father     Social History Social History  Tobacco Use  . Smoking status: Never Smoker  . Smokeless tobacco: Never Used  Substance Use Topics  . Alcohol use: Yes    Comment: occ  . Drug use: No     Allergies   Patient has no known allergies.   Review of Systems Review of Systems  Constitutional: Negative for chills and fever.  HENT: Positive for dental problem and facial swelling. Negative for drooling and trouble swallowing.   Respiratory: Negative for shortness of breath.   Cardiovascular: Negative for chest pain.  Gastrointestinal: Negative for abdominal pain, constipation, diarrhea, nausea and vomiting.  Genitourinary: Negative for  dysuria and hematuria.  Musculoskeletal: Negative for arthralgias and myalgias.  Skin: Negative for color change.  Allergic/Immunologic: Negative for immunocompromised state.  Neurological: Negative for weakness and numbness.  Psychiatric/Behavioral: Negative for confusion.   All other systems reviewed and are negative for acute change except as noted in the HPI.    Physical Exam Updated Vital Signs BP 121/66 (BP Location: Right Arm)   Pulse 68   Temp 98.5 F (36.9 C) (Oral)   Resp 20   Ht 6\' 1"  (1.854 m)   Wt 78 kg (172 lb)   SpO2 100%   BMI 22.69 kg/m   Physical Exam  Constitutional: He is oriented to person, place, and time. Vital signs are normal. He appears well-developed and well-nourished.  Non-toxic appearance. No distress.  Afebrile, nontoxic, NAD  HENT:  Head: Normocephalic and atraumatic.  Nose: Nose normal.  Mouth/Throat: Uvula is midline, oropharynx is clear and moist and mucous membranes are normal. No trismus in the jaw. Dental abscesses and dental caries present. No uvula swelling. Tonsils are 0 on the right. Tonsils are 0 on the left. No tonsillar exudate.    R lower molar #30 decayed, with surrounding gingival swelling and fluctuance extending towards tooth #27, mildly erythematous; no evidence of ludwig's. Airway patent and handling secretions well.  Nose clear. Oropharynx clear and moist, without uvular swelling or deviation, no trismus or drooling, no tonsillar swelling or erythema, no exudates.    Eyes: Conjunctivae and EOM are normal. Right eye exhibits no discharge. Left eye exhibits no discharge.  Neck: Normal range of motion. Neck supple.  Cardiovascular: Normal rate and intact distal pulses.  Pulmonary/Chest: Effort normal. No respiratory distress.  Abdominal: Normal appearance. He exhibits no distension.  Musculoskeletal: Normal range of motion.  Neurological: He is alert and oriented to person, place, and time. He has normal strength. No sensory  deficit.  Skin: Skin is warm, dry and intact. No rash noted.  Psychiatric: He has a normal mood and affect.  Nursing note and vitals reviewed.    ED Treatments / Results  Labs (all labs ordered are listed, but only abnormal results are displayed) Labs Reviewed - No data to display  EKG  EKG Interpretation None       Radiology No results found.   06/18/17 CT neck soft tissue Study Result:  CLINICAL DATA:  Increased right-sided facial swelling.  EXAM: CT NECK WITH CONTRAST  TECHNIQUE: Multidetector CT imaging of the neck was performed using the standard protocol following the bolus administration of intravenous contrast.  CONTRAST:  75mL ISOVUE-300 IOPAMIDOL (ISOVUE-300) INJECTION 61%  COMPARISON:  None.  FINDINGS: Pharynx and larynx: Negative for inflammation or masslike finding.  Salivary glands: Negative  Thyroid: Negative  Lymph nodes: Mild reactive nodal enlargement in the submental space.  Vascular: Negative  Limited intracranial: Negative  Visualized orbits: Normal where seen  Mastoids and visualized paranasal  sinuses: Clear where seen.  Skeleton: Periapical erosion about the root of tooth 30. There is soft tissue inflammation in the right face that is centered on this tooth. No discrete subperiosteal abscess. No floor of mouth inflammation.  Upper chest: Negative  IMPRESSION: Odontogenic infection in the right face associated with tooth 30. No abscess or floor of mouth inflammation.   Electronically Signed   By: Marnee Spring M.D.   On: 06/18/2017 17:50     Procedures .Marland KitchenIncision and Drainage Date/Time: 06/22/2017 12:53 PM Performed by: Rhona Raider, PA-C Authorized by: Rhona Raider, [place]   Consent:    Consent obtained:  Verbal   Consent given by:  Patient   Risks discussed:  Bleeding, incomplete drainage and pain   Alternatives discussed:  Alternative treatment and referral Location:    Type:   Abscess   Location:  Mouth   Mouth location:  Alveolar process Anesthesia (see MAR for exact dosages):    Anesthesia method:  Topical application   Topical anesthesia: cetacaine spray. Procedure type:    Complexity:  Simple Procedure details:    Needle aspiration: yes     Needle size:  18 G   Incision depth:  Submucosal   Drainage:  Purulent   Drainage amount:  Moderate   Wound treatment:  Wound left open   Packing materials:  None Post-procedure details:    Patient tolerance of procedure:  Tolerated well, no immediate complications   (including critical care time)  Medications Ordered in ED Medications  benzocaine (HURRICAINE) 20 % oral spray ( Mouth/Throat Not Given 06/22/17 1250)  butamben-tetracaine-benzocaine (CETACAINE) spray 1 spray (1 spray Topical Given by Other 06/22/17 1222)     Initial Impression / Assessment and Plan / ED Course  I have reviewed the triage vital signs and the nursing notes.  Pertinent labs & imaging results that were available during my care of the patient were reviewed by me and considered in my medical decision making (see chart for details).     26 y.o. male here with ongoing R lower dental pain and swelling despite being on clindamycin for several days. Had multiple ED visits for this, and had a CT neck on 06/18/17 which showed odontogenic infection of R jaw/face but no abscess. On exam today, mild R lower jaw swelling adjacent to tooth #30, fluctuant dental abscess along the gumline around this tooth, tooth slightly decayed. No trismus or drooling, handling secretions well. Nontoxic and afebrile. Will drain dental abscess and I suspect this will significantly improve his swelling. Doubt need for further labs/imaging at this time. Doubt this is a failure of therapy, as it has done exactly what we suspect it should. Will await hurricane spray and reassess after I&D.   12:55 PM I&D performed, and produced ~5cc of purulent drainage from the  abscess. Needle wound left open to continue draining. Advised continuation of abx that he's on, advised warm compresses and warm salt water swishes, and other OTC remedies for symptomatic relief, and f/up with oral surgeon ASAP for ongoing management. I explained the diagnosis and have given explicit precautions to return to the ER including for any other new or worsening symptoms. The patient understands and accepts the medical plan as it's been dictated and I have answered their questions. Discharge instructions concerning home care and prescriptions have been given. The patient is STABLE and is discharged to home in good condition.    Final Clinical Impressions(s) / ED Diagnoses   Final diagnoses:  Dental abscess  Pain  due to dental caries    ED Discharge Orders    [address]       Daveda Larock, [place] HillMercedes, [place]PA-C 06/22/17 1255    Abelino DerrickMackuen, Courteney Lyn, MD 06/25/17 2352

## 2017-06-22 NOTE — ED Triage Notes (Signed)
Pt presents for evaluation of R sided facial swelling and dental pain. Reports seen here 3 times for same, taking abx with no improvement.

## 2018-06-02 ENCOUNTER — Emergency Department (HOSPITAL_COMMUNITY)
Admission: EM | Admit: 2018-06-02 | Discharge: 2018-06-02 | Disposition: A | Discharge status: Home / Self Care | Payer: Self-pay | Attending: Emergency Medicine | Admitting: Emergency Medicine

## 2018-06-02 ENCOUNTER — Other Ambulatory Visit: Payer: Self-pay

## 2018-06-02 ENCOUNTER — Encounter (HOSPITAL_COMMUNITY): Payer: Self-pay | Admitting: *Deleted

## 2018-06-02 DIAGNOSIS — K047 Periapical abscess without sinus: Secondary | ICD-10-CM | POA: Insufficient documentation

## 2018-06-02 MED ORDER — AMOXICILLIN 500 MG PO CAPS: 500.0000 mg | ORAL_CAPSULE | Freq: Three times a day (TID) | ORAL | 0 refills | Status: DC

## 2018-06-02 MED ORDER — AMOXICILLIN 500 MG PO CAPS
500.0000 mg | ORAL_CAPSULE | Freq: Once | ORAL | Status: AC
  Administered 2018-06-02: 500 mg via ORAL
  Filled 2018-06-02: qty 1

## 2018-06-02 NOTE — ED Provider Notes (Signed)
[facility]-EMERGENCY DEPT Provider Note   CSN: [id number] Arrival date & time: 06/02/18  1216     History   Chief Complaint Chief Complaint  Patient presents with  . Facial Swelling    jaw swelling on right    HPI Francisco Patterson is a 27 y.o. male who presents to the ED with c/o jaw swelling. Patient reports he has a broken tooth that is decayed and causing pain and swelling. He states he has an appointment for next Friday but came today due to the swelling.   HPI  Past Medical History:  Diagnosis Date  . Mass    left wrist    There are no active problems to display for this patient.   Past Surgical History:  Procedure Laterality Date  . MASS EXCISION Left 12/17/2013   Procedure: LEFT WRIST DEEP MASS EXCISION;  Surgeon: Sharma Covert, MD;  [facility];  Service: Orthopedics;  Laterality: Left;        Home Medications    Prior to Admission medications   Medication Sig Start Date End Date Taking? Authorizing Provider  amoxicillin (AMOXIL) 500 MG capsule Take 1 capsule (500 mg total) by mouth 3 (three) times daily. 06/02/18   Janne Napoleon, NP  ibuprofen (ADVIL,MOTRIN) 800 MG tablet Take 1 tablet (800 mg total) by mouth 3 (three) times daily. 06/20/17   Fayrene Helper, PA-C    Family History Family History  Problem Relation Age of Onset  . Diabetes Mother   . Hypertension Father   . Diabetes Father     Social History Social History   Tobacco Use  . Smoking status: Never Smoker  . Smokeless tobacco: Never Used  Substance Use Topics  . Alcohol use: Yes    Comment: occ  . Drug use: No     Allergies   Patient has no known allergies.   Review of Systems Review of Systems  HENT: Positive for dental problem and facial swelling.   All other systems reviewed and are negative.    Physical Exam Updated Vital Signs BP 125/68 (BP Location: Left Arm)   Pulse 81   Temp 98.2 F (36.8 C) (Oral)   Resp 16    Ht 6\' 1"  (1.854 m)   Wt 88.5 kg   SpO2 100%   BMI 25.73 kg/m   Physical Exam  Constitutional: He appears well-developed and well-nourished. No distress.  HENT:  Mouth/Throat: Uvula is midline. Dental abscesses and dental caries present.    Facial swelling right side, right lower first molar broken and decayed. Swelling and erythema of the gum surrounding the tooth.  Eyes: EOM are normal.  Neck: Neck supple.  Cardiovascular: Normal rate.  Pulmonary/Chest: Effort normal.  Musculoskeletal: Normal range of motion.  Lymphadenopathy:    He has cervical adenopathy (right).  Neurological: He is alert.  Skin: Skin is warm and dry.  Psychiatric: He has a normal mood and affect.  Nursing note and vitals reviewed.    ED Treatments / Results  Labs (all labs ordered are listed, but only abnormal results are displayed) Labs Reviewed - No data to display  Radiology No results found.  Procedures Procedures (including critical care time)  Medications Ordered in ED Medications  amoxicillin (AMOXIL) capsule 500 mg (has no administration in time range)     Initial Impression / Assessment and Plan / ED Course  I have reviewed the triage vital signs and the nursing notes. Patient with toothache.  Swelling  of the right side of face. Probably early abscessed tooth right lower first molar.  Exam unconcerning for Ludwig's angina or spread of infection.  Will treat with amoxicillin and anti-inflammatories medicine.  Urged patient to follow-up with dentist as scheduled this week.    Final Clinical Impressions(s) / ED Diagnoses   Final diagnoses:  Dental abscess    ED Discharge Orders         Ordered    amoxicillin (AMOXIL) 500 MG capsule  3 times daily     06/02/18 1314           [place] ValleyNeese, SatillaHope M, NP 06/02/18 1319    Samuel JesterMcManus, Kathleen, DO 06/02/18 1613

## 2018-06-02 NOTE — ED Notes (Signed)
Bed: WTR9 Expected date:  Expected time:  Means of arrival:  Comments: 

## 2018-06-02 NOTE — ED Triage Notes (Signed)
Pt presents with right lower jaw swelling.  Pt states he has a broken tooth in which he has an appointment for next Friday.  Pt wants to swelling to be evaluated.

## 2018-06-02 NOTE — Discharge Instructions (Addendum)
Keep your appointment this week with your dentist as planned. Take the antibiotics and take tylenol and ibuprofen as needed for pain.

## 2018-11-08 IMAGING — CT CT NECK W/ CM
4 of 5 series · 15 of 33 positions shown, 17 images · IV contrast (Omni 300)
Comparison: None.

CLINICAL DATA: Increased right-sided facial swelling.

EXAM:
CT NECK WITH CONTRAST
TECHNIQUE: Multidetector CT imaging of the neck was performed using the
standard protocol following the bolus administration of intravenous
contrast.
CONTRAST:  75mL FTFR4V-3VV IOPAMIDOL (FTFR4V-3VV) INJECTION 61%

[Series 3: neck 2.0 st · axial · 0.45mm/px · z∈[-242,-98]mm · 4 of 121 slices shown, 5 images (1 of 3)]
[im 25/121  soft-tissue]
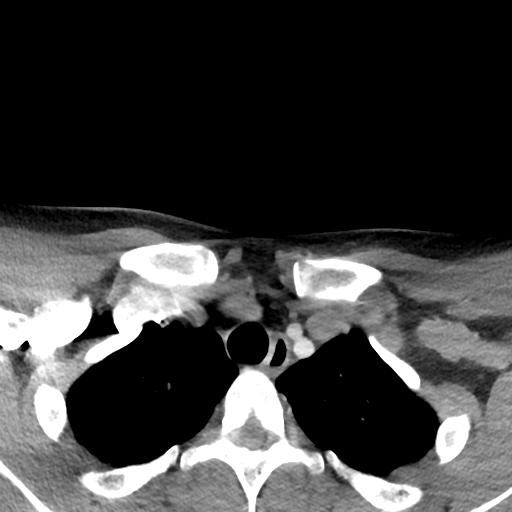
[im 25/121  bone]
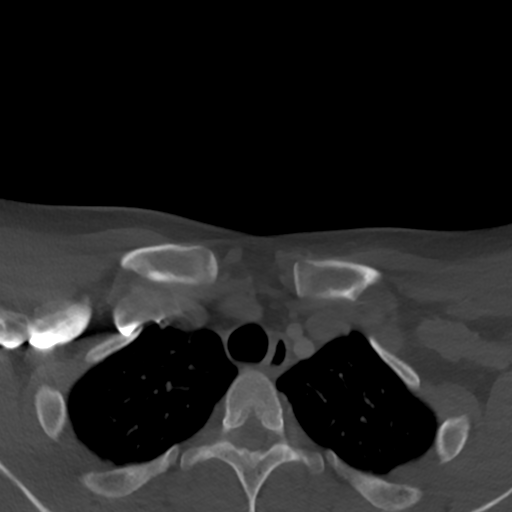
[im 49/121  bone]
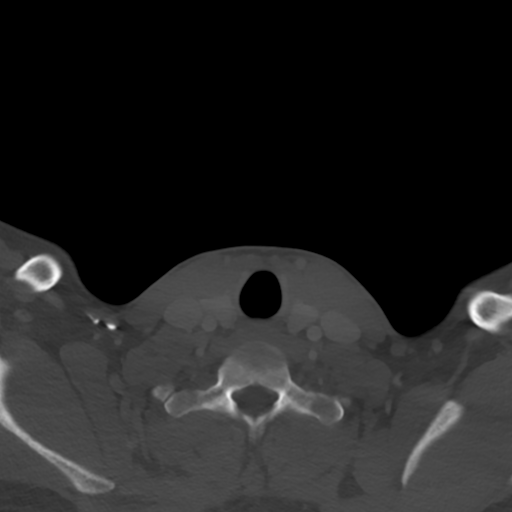
[im 73/121  bone]
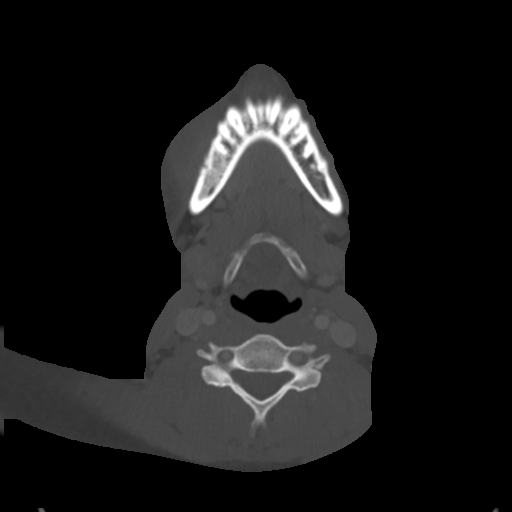
[im 97/121  bone]
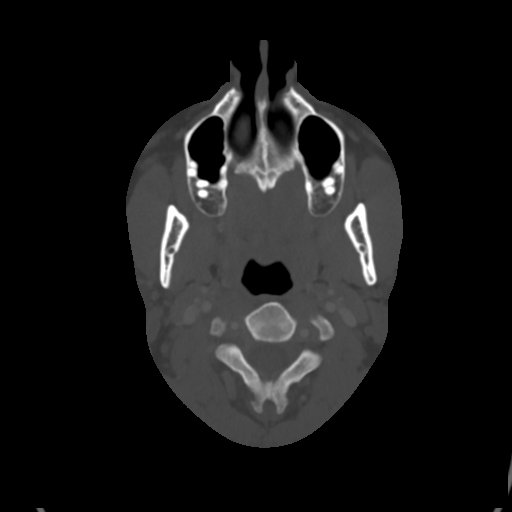

[Series 6: neck 2.0 st · sagittal · 0.47mm/px · 5 of 101 slices shown, 6 images (2 of 3)]
[im 34/101  bone]
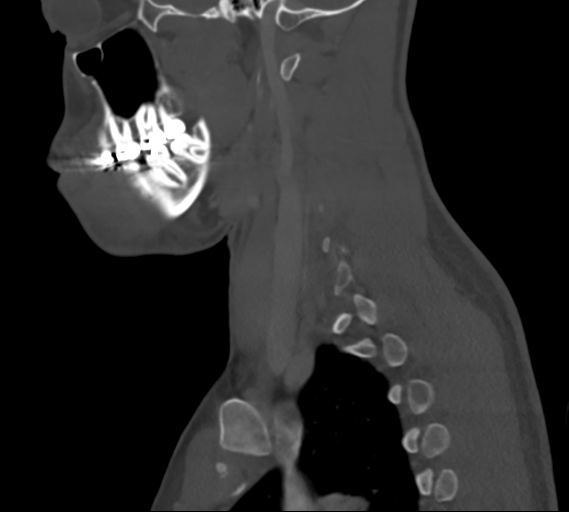
[im 42/101  bone]
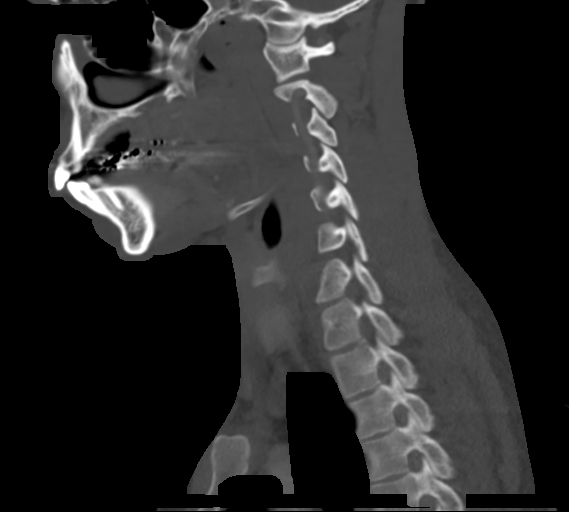
[im 51/101  soft-tissue]
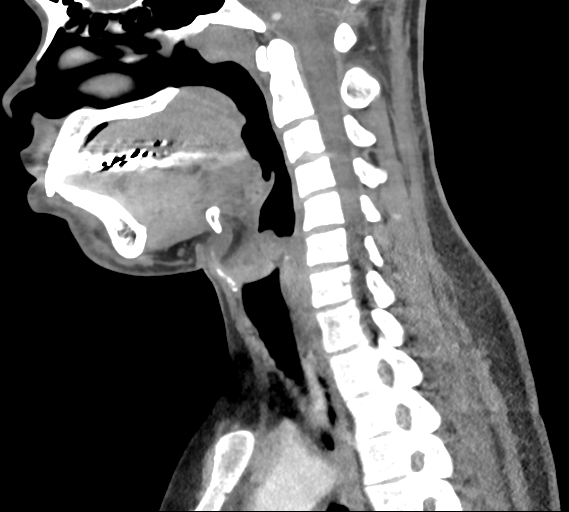
[im 51/101  bone]
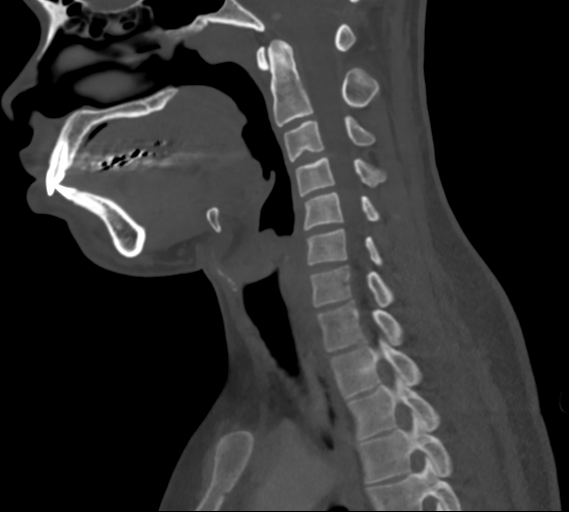
[im 59/101  bone]
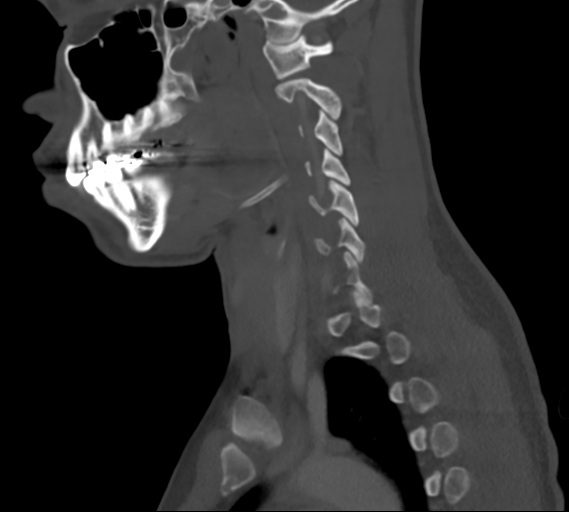
[im 67/101  bone]
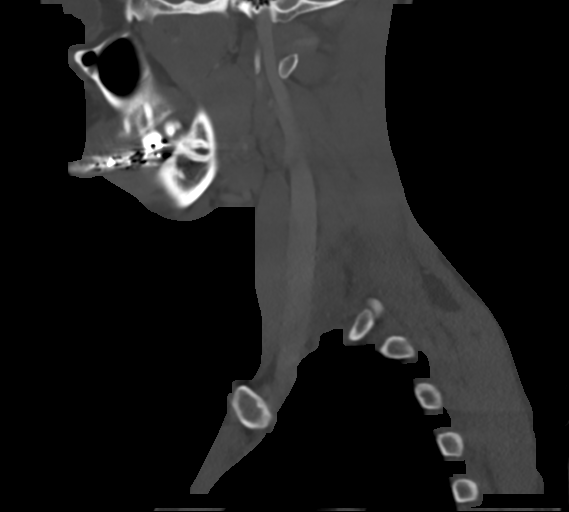

[Series 7: neck 2.0 st · coronal · 0.37mm/px · 3 of 121 slices shown (3 of 3)]
[im 25/121  bone]
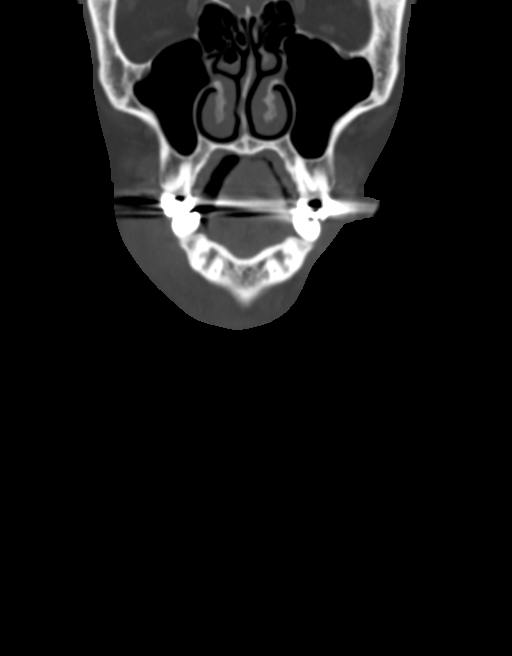
[im 49/121  bone]
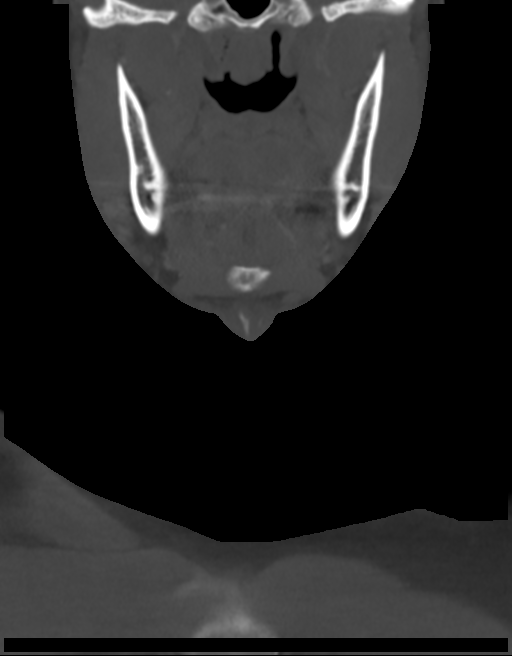
[im 73/121  bone]
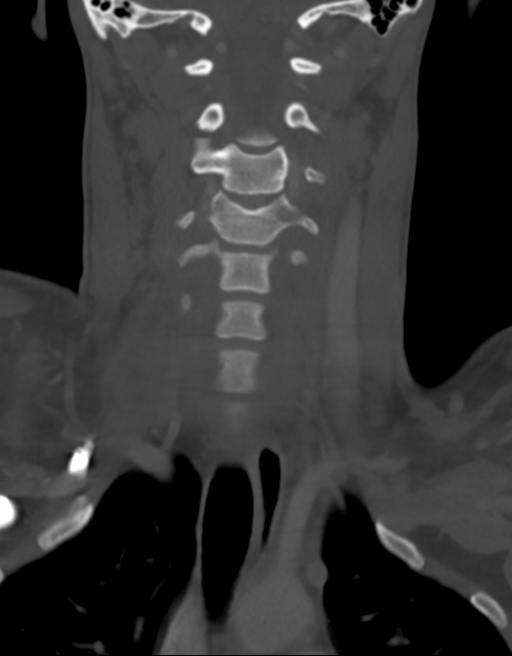

[Series 8: neck 2.0 st orthogonal · axial · 0.39mm/px · z∈[-272,-172]mm · 3 of 129 slices shown]
[im 26/129  bone]
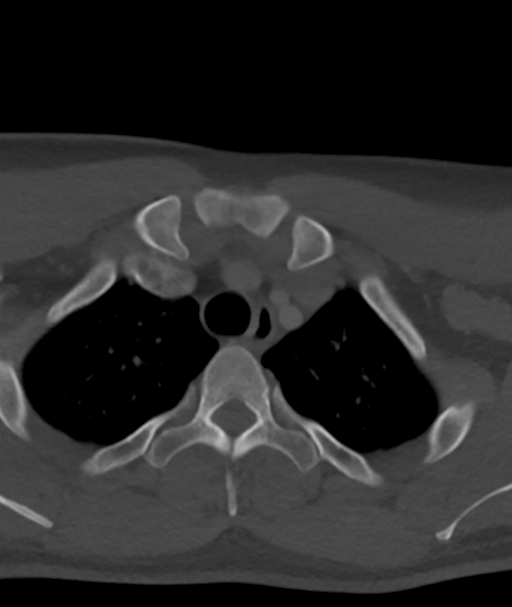
[im 52/129  bone]
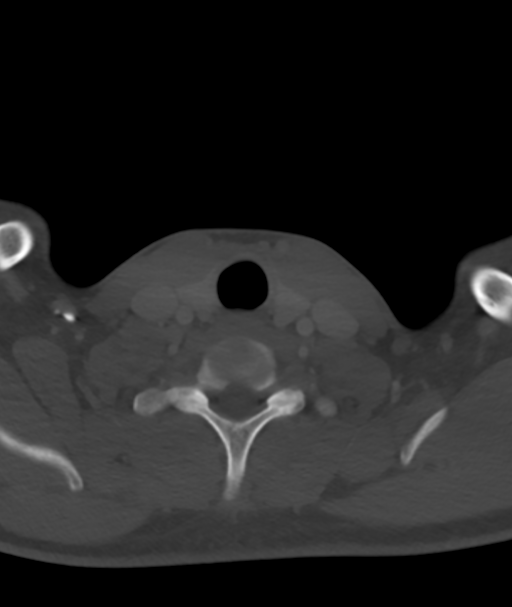
[im 77/129  bone]
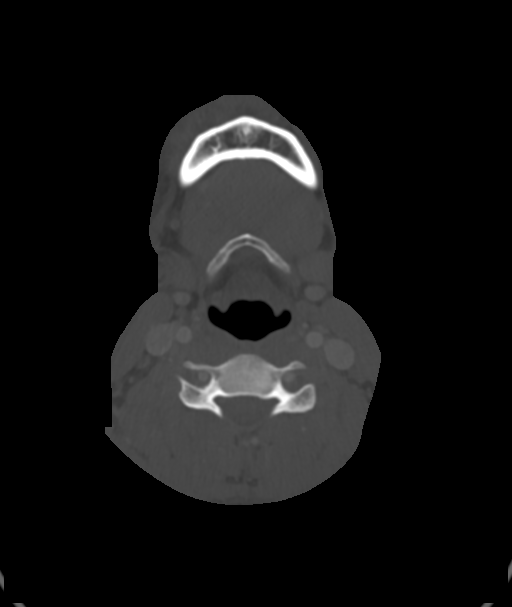

[15 of 33 positions shown; findings below may reference images not displayed]

FINDINGS: Pharynx and larynx: Negative for inflammation or masslike finding.

Salivary glands: Negative

Thyroid: Negative

Lymph nodes: Mild reactive nodal enlargement in the submental space.

Vascular: Negative

Limited intracranial: Negative

Visualized orbits: Normal where seen

Mastoids and visualized paranasal sinuses: Clear where seen.

Skeleton: Periapical erosion about the root of tooth 30. There is
soft tissue inflammation in the right face that is centered on this
tooth. No discrete subperiosteal abscess. No floor of mouth
inflammation.

Upper chest: Negative
IMPRESSION: Odontogenic infection in the right face associated with tooth 30. No
abscess or floor of mouth inflammation.

## 2022-03-09 ENCOUNTER — Ambulatory Visit (HOSPITAL_COMMUNITY)
Admission: EM | Admit: 2022-03-09 | Discharge: 2022-03-09 | Disposition: A | Discharge status: Home / Self Care | Payer: Self-pay | Attending: Physician Assistant | Admitting: Physician Assistant

## 2022-03-09 ENCOUNTER — Encounter (HOSPITAL_COMMUNITY): Payer: Self-pay | Admitting: *Deleted

## 2022-03-09 DIAGNOSIS — K0889 Other specified disorders of teeth and supporting structures: Secondary | ICD-10-CM

## 2022-03-09 DIAGNOSIS — K047 Periapical abscess without sinus: Secondary | ICD-10-CM

## 2022-03-09 MED ORDER — IBUPROFEN 600 MG PO TABS: 600.0000 mg | ORAL_TABLET | Freq: Four times a day (QID) | ORAL | 0 refills | Status: AC | PRN

## 2022-03-09 MED ORDER — AMOXICILLIN-POT CLAVULANATE 875-125 MG PO TABS: 1.0000 | ORAL_TABLET | Freq: Two times a day (BID) | ORAL | 0 refills | Status: DC

## 2022-03-09 NOTE — Discharge Instructions (Signed)
Advised to take the Augmentin every 12 hours with food to help treat the abscess infection. Advise use ibuprofen 600 mg every 8 hours with food to help treat the swelling and discomfort. Advised to see a dentist within the next week while you are on the antibiotics so that they can help to resolve the tooth issue. Follow-up PCP or return to urgent care as needed.

## 2022-03-09 NOTE — ED Provider Notes (Signed)
[facility]    CSN: [id number] Arrival date & time: 03/09/22  0907      History   Chief Complaint Chief Complaint  Patient presents with   Oral Swelling    HPI Francisco Patterson is a 31 y.o. male.   31 year old male presents with right lower tooth abscess.  Patient indicates that he did see a 1 dental last week but this was before he started having problems with his tooth.  He indicates over the past 24 hours he has started having pain, swelling, tenderness of the right lower tooth.  He denies any fever or chills.  He has been taking some ibuprofen with mild relief from the discomfort.  He is mainly here today to get an antibiotic to treat the infection.     Past Medical History:  Diagnosis Date   Mass    left wrist    There are no problems to display for this patient.   Past Surgical History:  Procedure Laterality Date   MASS EXCISION Left 12/17/2013   Procedure: LEFT WRIST DEEP MASS EXCISION;  Surgeon: Sharma Covert, MD;  Location: [facility];  Service: Orthopedics;  Laterality: Left;       Home Medications    Prior to Admission medications   Medication Sig Start Date End Date Taking? Authorizing Provider  amoxicillin-clavulanate (AUGMENTIN) 875-125 MG tablet Take 1 tablet by mouth every 12 (twelve) hours. 03/09/22  Yes Ellsworth Lennox, PA-C  ibuprofen (ADVIL) 600 MG tablet Take 1 tablet (600 mg total) by mouth every 6 (six) hours as needed. 03/09/22  Yes Ellsworth Lennox, PA-C  amoxicillin (AMOXIL) 500 MG capsule Take 1 capsule (500 mg total) by mouth 3 (three) times daily. 06/02/18   Janne Napoleon, NP  ibuprofen (ADVIL,MOTRIN) 800 MG tablet Take 1 tablet (800 mg total) by mouth 3 (three) times daily. 06/20/17   Fayrene Helper, PA-C    Family History Family History  Problem Relation Age of Onset   Diabetes Mother    Hypertension Father    Diabetes Father     Social History Social History   Tobacco Use   Smoking status: Never   Smokeless  tobacco: Never  Vaping Use   Vaping Use: Some days  Substance Use Topics   Alcohol use: Yes    Comment: occ   Drug use: No     Allergies   Patient has no known allergies.   Review of Systems Review of Systems  HENT:  Positive for dental problem (right lower incisor abscess).      Physical Exam Triage Vital Signs ED Triage Vitals  Enc Vitals Group     BP 03/09/22 1001 118/76     Pulse Rate 03/09/22 1001 (!) 56     Resp 03/09/22 1001 18     Temp 03/09/22 1001 98.2 F (36.8 C)     Temp Source 03/09/22 1001 Oral     SpO2 03/09/22 1001 98 %     Weight --      Height --      Head Circumference --      Peak Flow --      Pain Score 03/09/22 1000 8     Pain Loc --      Pain Edu? --      Excl. in GC? --    No data found.  Updated Vital Signs BP 118/76 (BP Location: Left Arm)   Pulse (!) 56   Temp 98.2 F (36.8 C) (Oral)  Resp 18   SpO2 98%   Visual Acuity Right Eye Distance:   Left Eye Distance:   Bilateral Distance:    Right Eye Near:   Left Eye Near:    Bilateral Near:     Physical Exam Constitutional:      Appearance: Normal appearance.  HENT:     Mouth/Throat:     Mouth: Mucous membranes are moist.     Dentition: Abnormal dentition (abcess right lower incisor with 1 plus swelling present, no drainage.). Dental tenderness (right lower incisor) present.     Pharynx: Oropharynx is clear.  Neurological:     Mental Status: He is alert.      UC Treatments / Results  Labs (all labs ordered are listed, but only abnormal results are displayed) Labs Reviewed - No data to display  EKG   Radiology No results found.  Procedures Procedures (including critical care time)  Medications Ordered in UC Medications - No data to display  Initial Impression / Assessment and Plan / UC Course  I have reviewed the triage vital signs and the nursing notes.  Pertinent labs & imaging results that were available during my care of the patient were reviewed by  me and considered in my medical decision making (see chart for details).       Plan: 1.  Advised take Augmentin every 12 hours with food to help treat the abscess infection. 2.  Advised take ibuprofen 600 mg every 8 hours to help treat the swelling and pain. 3.  Advised to see a dentist within the next week to treat the tooth and abscess formation. 2.  Advised follow-up PCP or return to urgent care if symptoms fail to improve. Final Clinical Impressions(s) / UC Diagnoses   Final diagnoses:  Dental abscess  Pain, dental     Discharge Instructions      Advised to take the Augmentin every 12 hours with food to help treat the abscess infection. Advise use ibuprofen 600 mg every 8 hours with food to help treat the swelling and discomfort. Advised to see a dentist within the next week while you are on the antibiotics so that they can help to resolve the tooth issue. Follow-up PCP or return to urgent care as needed.     ED Prescriptions     Medication Sig Dispense Auth. Provider   amoxicillin-clavulanate (AUGMENTIN) 875-125 MG tablet Take 1 tablet by mouth every 12 (twelve) hours. 14 tablet Ellsworth Lennox, PA-C   ibuprofen (ADVIL) 600 MG tablet Take 1 tablet (600 mg total) by mouth every 6 (six) hours as needed. 30 tablet Ellsworth Lennox, PA-C      PDMP not reviewed this encounter.   Ellsworth Lennox, PA-C 03/09/22 1044

## 2022-03-09 NOTE — ED Triage Notes (Signed)
Pt states that over night he has oral pain and swelling on the right side. He did use IBU and oral gel otc without help.

## 2022-04-30 ENCOUNTER — Telehealth: Payer: BC Managed Care – PPO | Admitting: Physician Assistant

## 2022-04-30 DIAGNOSIS — K047 Periapical abscess without sinus: Secondary | ICD-10-CM | POA: Diagnosis not present

## 2022-04-30 MED ORDER — IBUPROFEN 800 MG PO TABS: 800.0000 mg | ORAL_TABLET | Freq: Three times a day (TID) | ORAL | 0 refills | Status: AC

## 2022-04-30 MED ORDER — AMOXICILLIN 500 MG PO CAPS: 500.0000 mg | ORAL_CAPSULE | Freq: Three times a day (TID) | ORAL | 0 refills | Status: AC

## 2022-04-30 NOTE — Patient Instructions (Signed)
Juanda Bond, thank you for joining Mar Daring, PA-C for today's virtual visit.  While this provider is not your primary care provider (PCP), if your PCP is located in our provider database this encounter information will be shared with them immediately following your visit.   [facility] account gives you access to today's visit and all your visits, tests, and labs performed at [facility] " click here if you don't have a [facility] account or go to [email or web address]  Consent: (Patient) Francisco Patterson provided verbal consent for this virtual visit at the beginning of the encounter.  Current Medications:  Current Outpatient Medications:    amoxicillin (AMOXIL) 500 MG capsule, Take 1 capsule (500 mg total) by mouth 3 (three) times daily for 10 days., Disp: 30 capsule, Rfl: 0   ibuprofen (ADVIL) 600 MG tablet, Take 1 tablet (600 mg total) by mouth every 6 (six) hours as needed., Disp: 30 tablet, Rfl: 0   ibuprofen (ADVIL) 800 MG tablet, Take 1 tablet (800 mg total) by mouth 3 (three) times daily., Disp: 30 tablet, Rfl: 0   Medications ordered in this encounter:  Meds ordered this encounter  Medications   amoxicillin (AMOXIL) 500 MG capsule    Sig: Take 1 capsule (500 mg total) by mouth 3 (three) times daily for 10 days.    Dispense:  30 capsule    Refill:  0    Order Specific Question:   Supervising Provider    Answer:   Chase Picket [id number]   ibuprofen (ADVIL) 800 MG tablet    Sig: Take 1 tablet (800 mg total) by mouth 3 (three) times daily.    Dispense:  30 tablet    Refill:  0    Order Specific Question:   Supervising Provider    Answer:   Chase Picket [id number]     *If you need refills on other medications prior to your next appointment, please contact your pharmacy*  Follow-Up: Call back or seek an in-person evaluation if the symptoms worsen or if the condition fails to improve as anticipated.  [place] [phone]  Other Instructions Dental Abscess  A dental abscess is an area of pus in or around a tooth. It comes from an infection. It can cause pain and other symptoms. Treatment will help with symptoms and prevent the infection from spreading. What are the causes? This condition is caused by an infection in or around the tooth. This can be from: Very bad tooth decay (cavities). A bad injury to the tooth, such as a broken or chipped tooth. What increases the risk? The risk to get an abscess is higher in males. It is also more likely in people who: Have dental decay. Have very bad gum disease. Eat sugary snacks between meals. Use tobacco. Have diabetes. Have a weak disease-fighting system (immune system). Do not brush their teeth regularly. What are the signs or symptoms? Some mild symptoms are: Tenderness. Bad breath. Fever. A sharp, sour taste in the mouth. Pain in and around the infected tooth. Worse symptoms of this condition include: Swollen neck glands. Chills. Pus draining around the tooth. Swelling and redness around the tooth, the mouth, or the face. Very bad pain in and around the tooth. The worst symptoms can include: Difficulty swallowing. Difficulty opening your mouth. Feeling like you may vomit or vomiting. How is this treated? This is treated by getting rid of the infection. Your dentist will discuss ways  to do this, including: Antibiotic medicines. Antibacterial mouth rinse. An incision in the abscess to drain out the pus. A root canal. Removing the tooth. Follow these instructions at home: Medicines Take over-the-counter and prescription medicines only as told by your dentist. If you were prescribed an antibiotic medicine, take it as told by your dentist. Do not stop taking it even if you start to feel better. If you were prescribed a gel that has numbing medicine in it, use it exactly as told. Ask your dentist if you should avoid  driving or using machines while you are taking your medicine. General instructions Rinse your mouth often with salt water. To make salt water, dissolve -1 tsp (3-6 g) of salt in 1 cup (237 mL) of warm water. Eat a soft diet while your mouth is healing. Drink enough fluid to keep your pee (urine) pale yellow. Do not apply heat to the outside of your mouth. Do not smoke or use any products that contain nicotine or tobacco. If you need help quitting, ask your dentist. Keep all follow-up visits. Prevent an abscess Brush your teeth every morning and every night. Use fluoride toothpaste. Floss your teeth each day. Get dental cleanings as often as told by your dentist. Think about getting dental sealant put on teeth that have deep holes (decay). Drink water that has fluoride in it. Most tap water has fluoride. Check the label on bottled water to see if it has fluoride in it. Drink water instead of sugary drinks. Eat healthy meals and snacks. Wear a mouth guard or face shield when you play sports. Contact a doctor if: Your pain is worse and medicine does not help. Get help right away if: You have a fever or chills. Your symptoms suddenly get worse. You have a very bad headache. You have problems breathing or swallowing. You have trouble opening your mouth. You have swelling in your neck or close to your eye. These symptoms may be an emergency. Get help right away. Call your local emergency services (911 in the [place] Do not wait to see if the symptoms will go away. Do not drive yourself to the hospital. Summary A dental abscess is an area of pus in or around a tooth. It is caused by an infection. Treatment will help with symptoms and prevent the infection from spreading. Take over-the-counter and prescription medicines only as told by your dentist. To prevent an abscess, take good care of your teeth. Brush your teeth every morning and night. Use floss every day. Get dental cleanings as  often as told by your dentist. This information is not intended to replace advice given to you by your health care provider. Make sure you discuss any questions you have with your health care provider. Document Revised: 08/18/2020 Document Reviewed: 08/18/2020 Elsevier Patient Education  2023 Elsevier Inc.    If you have been instructed to have an in-person evaluation today at a local Urgent Care facility, please use the link below. It will take you to a list of all of our available [facility], including address, phone number and hours of operation. Please do not delay care.  [facility]  If you or a family member do not have a primary care provider, use the link below to schedule a visit and establish care. When you choose a [facility] primary care physician or advanced practice provider, you gain a long-term partner in health. Find a Primary Care Provider  Learn more about [facility]  in-office and virtual care options: [facility]

## 2022-04-30 NOTE — Progress Notes (Signed)
Virtual Visit Consent   [name], you are scheduled for a virtual visit with a [facility] provider today. Just as with appointments in the office, your consent must be obtained to participate. Your consent will be active for this visit and any virtual visit you may have with one of our providers in the next 365 days. If you have a MyChart account, a copy of this consent can be sent to you electronically.  As this is a virtual visit, video technology does not allow for your provider to perform a traditional examination. This may limit your provider's ability to fully assess your condition. If your provider identifies any concerns that need to be evaluated in person or the need to arrange testing (such as labs, EKG, etc.), we will make arrangements to do so. Although advances in technology are sophisticated, we cannot ensure that it will always work on either your end or our end. If the connection with a video visit is poor, the visit may have to be switched to a telephone visit. With either a video or telephone visit, we are not always able to ensure that we have a secure connection.  By engaging in this virtual visit, you consent to the provision of healthcare and authorize for your insurance to be billed (if applicable) for the services provided during this visit. Depending on your insurance coverage, you may receive a charge related to this service.  I need to obtain your verbal consent now. Are you willing to proceed with your visit today? [name] has provided verbal consent on 04/30/2022 for a virtual visit (video or telephone). Mar Daring, PA-C  Date: 04/30/2022 12:32 PM  Virtual Visit via Video Note   I, Mar Daring, connected with  Francisco Patterson  ([id number], [birth year]) on 04/30/22 at 12:30 PM EST by a video-enabled telemedicine application and verified that I am speaking with the correct person using two identifiers.  Location: Patient: Virtual Visit  Location Patient: Mobile Provider: Virtual Visit Location Provider: Home Office   I discussed the limitations of evaluation and management by telemedicine and the availability of in person appointments. The patient expressed understanding and agreed to proceed.    History of Present Illness: Francisco Patterson is a 31 y.o. who identifies as a male who was assigned male at birth, and is being seen today for possible dental abscess.  HPI: Dental Pain  This is a new problem. The current episode started in the past 7 days. The problem occurs constantly. The problem has been rapidly worsening. The pain is moderate. Associated symptoms include facial pain, sinus pressure and thermal sensitivity. Pertinent negatives include no difficulty swallowing, fever or oral bleeding. He has tried acetaminophen, NSAIDs and ice for the symptoms. The treatment provided no relief.   Has appt with Dentist to have pulled on May 06, 2022.  Problems: There are no problems to display for this patient.   Allergies: No Known Allergies Medications:  Current Outpatient Medications:    amoxicillin (AMOXIL) 500 MG capsule, Take 1 capsule (500 mg total) by mouth 3 (three) times daily for 10 days., Disp: 30 capsule, Rfl: 0   ibuprofen (ADVIL) 600 MG tablet, Take 1 tablet (600 mg total) by mouth every 6 (six) hours as needed., Disp: 30 tablet, Rfl: 0   ibuprofen (ADVIL) 800 MG tablet, Take 1 tablet (800 mg total) by mouth 3 (three) times daily., Disp: 30 tablet, Rfl: 0  Observations/Objective: Patient is well-developed, well-nourished in no acute  distress.  Resting comfortably at home.  Head is normocephalic, atraumatic.  No labored breathing.  Speech is clear and coherent with logical content.  Patient is alert and oriented at baseline.    Assessment and Plan: 1. Dental abscess - amoxicillin (AMOXIL) 500 MG capsule; Take 1 capsule (500 mg total) by mouth 3 (three) times daily for 10 days.  Dispense: 30 capsule; Refill:  0 - ibuprofen (ADVIL) 800 MG tablet; Take 1 tablet (800 mg total) by mouth 3 (three) times daily.  Dispense: 30 tablet; Refill: 0  - Suspected infection - Amoxicillin and ibuprofen prescribed - Can use ice on outside jaw/cheek for swelling - Can also take tylenol for pain with other medications - Discussed DenTemp putty that can be used to cover a broken tooth - Schedule a follow with a dentist as soon as possible (Can contact [facility] or [facility] [phone]] associated with [facility] health department if underinsured or uninsured) - Seek in person evaluation if symptoms fail to improve or if they worsen   Follow Up Instructions: I discussed the assessment and treatment plan with the patient. The patient was provided an opportunity to ask questions and all were answered. The patient agreed with the plan and demonstrated an understanding of the instructions.  A copy of instructions were sent to the patient via MyChart unless otherwise noted below.    The patient was advised to call back or seek an in-person evaluation if the symptoms worsen or if the condition fails to improve as anticipated.  Time:  I spent 8 minutes with the patient via telehealth technology discussing the above problems/concerns.    Margaretann Loveless, PA-C

## 2022-05-30 ENCOUNTER — Telehealth: Payer: BC Managed Care – PPO | Admitting: Physician Assistant

## 2022-05-30 DIAGNOSIS — K047 Periapical abscess without sinus: Secondary | ICD-10-CM | POA: Diagnosis not present

## 2022-05-30 MED ORDER — AMOXICILLIN-POT CLAVULANATE 875-125 MG PO TABS: 1.0000 | ORAL_TABLET | Freq: Two times a day (BID) | ORAL | 0 refills | Status: DC

## 2022-05-30 NOTE — Progress Notes (Signed)

## 2022-05-30 NOTE — Progress Notes (Signed)
I have spent 5 minutes in review of e-visit questionnaire, review and updating patient chart, medical decision making and response to patient.   Eydan Chianese Cody Alliya Marcon, PA-C    

## 2022-10-17 ENCOUNTER — Ambulatory Visit (INDEPENDENT_AMBULATORY_CARE_PROVIDER_SITE_OTHER): Payer: BC Managed Care – PPO

## 2022-10-17 ENCOUNTER — Encounter (HOSPITAL_COMMUNITY): Payer: Self-pay

## 2022-10-17 ENCOUNTER — Ambulatory Visit (HOSPITAL_COMMUNITY)
Admission: RE | Admit: 2022-10-17 | Discharge: 2022-10-17 | Disposition: A | Discharge status: Home / Self Care | Payer: BC Managed Care – PPO | Source: Ambulatory Visit | Attending: Emergency Medicine | Admitting: Emergency Medicine

## 2022-10-17 ENCOUNTER — Telehealth: Payer: BC Managed Care – PPO | Admitting: Nurse Practitioner

## 2022-10-17 VITALS — BP 124/73 | HR 88 | Temp 99.7°F | Resp 18

## 2022-10-17 DIAGNOSIS — M25561 Pain in right knee: Secondary | ICD-10-CM

## 2022-10-17 NOTE — Discharge Instructions (Addendum)
Your knee x-ray was negative for fracture or dislocation. I suspect your knee pain and swelling is from over-use and prolonged standing at work. Please use the RICE method, this is rest, ice, compress and elevate.  Please take the next few days off work.  If your symptoms persist, you can contact [facility] for further evaluation.  Please return to clinic for any new or concerning symptoms.

## 2022-10-17 NOTE — ED Triage Notes (Signed)
Pt c/o rt knee pain and swelling x1wk. Taking ibuprofen with no relief. Denies injury.

## 2022-10-17 NOTE — ED Provider Notes (Signed)
[facility]    CSN: [id number] Arrival date & time: 10/17/22  1737      History   Chief Complaint Chief Complaint  Patient presents with   Knee Pain    HPI Francisco Patterson is a 32 y.o. male.   Patient reports right knee pain and swelling that has been ongoing for a week.  At home he has been using ice, elevation and a knee brace as well as ibuprofen.  Last took ibuprofen around 9 AM this morning.  He works for [employer] and he has been given a new position where he is stationary and standing for prolonged periods.  He reports previous injuries to his right knee years prior in childhood, denies any recent injuries or falls.    The history is provided by the patient and medical records.  Knee Pain Associated symptoms: no fever     Past Medical History:  Diagnosis Date   Mass    left wrist    There are no problems to display for this patient.   Past Surgical History:  Procedure Laterality Date   MASS EXCISION Left 12/17/2013   Procedure: LEFT WRIST DEEP MASS EXCISION;  Surgeon: Sharma Covert, MD;  Location: [facility] [place];  Service: Orthopedics;  Laterality: Left;       Home Medications    Prior to Admission medications   Medication Sig Start Date End Date Taking? Authorizing Provider  ibuprofen (ADVIL) 600 MG tablet Take 1 tablet (600 mg total) by mouth every 6 (six) hours as needed. 03/09/22   Ellsworth Lennox, PA-C  ibuprofen (ADVIL) 800 MG tablet Take 1 tablet (800 mg total) by mouth 3 (three) times daily. 04/30/22   Margaretann Loveless, PA-C    Family History Family History  Problem Relation Age of Onset   Diabetes Mother    Hypertension Father    Diabetes Father     Social History Social History   Tobacco Use   Smoking status: Never   Smokeless tobacco: Never  Vaping Use   Vaping Use: Some days  Substance Use Topics   Alcohol use: Yes    Comment: occ   Drug use: No     Allergies   Patient has no known  allergies.   Review of Systems Review of Systems  Constitutional:  Negative for fever.  Respiratory:  Negative for cough and shortness of breath.   Cardiovascular:  Negative for chest pain.  Gastrointestinal:  Negative for abdominal pain.  Musculoskeletal:  Positive for joint swelling.     Physical Exam Triage Vital Signs ED Triage Vitals [10/17/22 1808]  Enc Vitals Group     BP      Pulse      Resp      Temp      Temp src      SpO2      Weight      Height      Head Circumference      Peak Flow      Pain Score 6     Pain Loc      Pain Edu?      Excl. in GC?    No data found.  Updated Vital Signs BP 124/73 (BP Location: Left Arm)   Pulse 88   Temp 99.7 F (37.6 C) (Oral)   Resp 18   SpO2 97%   Visual Acuity Right Eye Distance:   Left Eye Distance:   Bilateral Distance:    Right  Eye Near:   Left Eye Near:    Bilateral Near:     Physical Exam Vitals and nursing note reviewed.  Constitutional:      Appearance: Normal appearance.  HENT:     Head: Normocephalic and atraumatic.     Right Ear: External ear normal.     Left Ear: External ear normal.     Nose: Nose normal.     Mouth/Throat:     Mouth: Mucous membranes are moist.  Eyes:     General: No scleral icterus.    Conjunctiva/sclera: Conjunctivae normal.  Cardiovascular:     Rate and Rhythm: Normal rate and regular rhythm.  Pulmonary:     Effort: Pulmonary effort is normal. No respiratory distress.  Musculoskeletal:        General: Swelling present. No tenderness, deformity or signs of injury. Normal range of motion.     Right knee: Swelling present. No deformity, bony tenderness or crepitus. Normal range of motion. No tenderness. No ACL laxity.     Instability Tests: Anterior drawer test negative. Posterior drawer test negative.     Right lower leg: No edema.     Left lower leg: No edema.  Skin:    General: Skin is warm and dry.     Capillary Refill: Capillary refill takes less than 2  seconds.     Findings: No erythema or rash.  Neurological:     General: No focal deficit present.     Mental Status: He is alert and oriented to person, place, and time.  Psychiatric:        Mood and Affect: Mood normal.        Behavior: Behavior normal. Behavior is cooperative.      UC Treatments / Results  Labs (all labs ordered are listed, but only abnormal results are displayed) Labs Reviewed - No data to display  EKG   Radiology DG Knee Complete 4 Views Right  Result Date: 10/17/2022 CLINICAL DATA:  Right knee pain and swelling for 1 week. EXAM: RIGHT KNEE - COMPLETE 4+ VIEW COMPARISON:  None Available. FINDINGS: No evidence of fracture, dislocation, or joint effusion. No evidence of arthropathy or other focal bone abnormality. Soft tissues are unremarkable. IMPRESSION: Negative. Electronically Signed   By: Burman Nieves M.D.   On: 10/17/2022 19:13    Procedures Procedures (including critical care time)  Medications Ordered in UC Medications - No data to display  Initial Impression / Assessment and Plan / UC Course  I have reviewed the triage vital signs and the nursing notes.  Pertinent labs & imaging results that were available during my care of the patient were reviewed by me and considered in my medical decision making (see chart for details).  Vitals and triage reviewed, patient is hemodynamically stable.  One week of right knee pain and swelling, has been using the RICE method at home with some relief.  Denies any trauma or falls.  Without redness, erythema or warmth, low concern for cellulitis or septic joint.  Range of motion intact.  Without erythema or warmth, low concern for cellulitis.  Imaging in clinic negative for fracture or dislocation.  Advised follow-up with orthopedic if symptoms persist, return and follow-up precautions discussed, no questions at this time.    Final Clinical Impressions(s) / UC Diagnoses   Final diagnoses:  Acute pain of right  knee     Discharge Instructions      Your knee x-ray was negative for fracture or dislocation. I suspect your  knee pain and swelling is from over-use and prolonged standing at work. Please use the RICE method, this is rest, ice, compress and elevate.  Please take the next few days off work.  If your symptoms persist, you can contact [facility] for further evaluation.  Please return to clinic for any new or concerning symptoms.     ED Prescriptions   None    PDMP not reviewed this encounter.   Twain Stenseth, [place], [place] 10/17/22 [phone]

## 2022-10-17 NOTE — Progress Notes (Signed)
Virtual Visit Consent   Francisco Patterson, you are scheduled for a virtual visit with a [facility] provider today. Just as with appointments in the office, your consent must be obtained to participate. Your consent will be active for this visit and any virtual visit you may have with one of our providers in the next 365 days. If you have a MyChart account, a copy of this consent can be sent to you electronically.  As this is a virtual visit, video technology does not allow for your provider to perform a traditional examination. This may limit your provider's ability to fully assess your condition. If your provider identifies any concerns that need to be evaluated in person or the need to arrange testing (such as labs, EKG, etc.), we will make arrangements to do so. Although advances in technology are sophisticated, we cannot ensure that it will always work on either your end or our end. If the connection with a video visit is poor, the visit may have to be switched to a telephone visit. With either a video or telephone visit, we are not always able to ensure that we have a secure connection.  By engaging in this virtual visit, you consent to the provision of healthcare and authorize for your insurance to be billed (if applicable) for the services provided during this visit. Depending on your insurance coverage, you may receive a charge related to this service.  I need to obtain your verbal consent now. Are you willing to proceed with your visit today? Francisco Patterson has provided verbal consent on 10/17/2022 for a virtual visit (video or telephone). Francisco Simas, Francisco Patterson  Date: 10/17/2022 11:49 AM  Virtual Visit via Video Note   I, Francisco Patterson, connected with  Francisco Patterson  ([id number], Apr 28, 1991) on 10/17/22 at 11:45 AM EDT by a video-enabled telemedicine application and verified that I am speaking with the correct person using two identifiers.  Location: Patient: Virtual Visit Location Patient:  Other: work Provider: Pharmacist, community: Home Office   I discussed the limitations of evaluation and management by telemedicine and the availability of in person appointments. The patient expressed understanding and agreed to proceed.    History of Present Illness: Francisco Patterson is a 32 y.o. who identifies as a male who was assigned male at birth, and is being seen today for right knee pain.  This has been bothering him intermittently over the past few weeks but has also been a recurrent problem  Denies any recent fall or trauma   He has had pain in his knee in the past and wears a brace at times  He has not had any surgeries in the past  Has had several falls as a child when skateboarding  Notices when he stands too long or wears certain shoes it will bother his knee   Today at work his knee started to really bother him so he went to see the nurse at work.  Nurse felt it was swollen   He is has been icing his knee and took ibuprofen without relief    He does have an appointment later today at [facility]   Problems: There are no problems to display for this patient.   Allergies: No Known Allergies Medications:  Current Outpatient Medications:    amoxicillin-clavulanate (AUGMENTIN) 875-125 MG tablet, Take 1 tablet by mouth 2 (two) times daily., Disp: 14 tablet, Rfl: 0   ibuprofen (ADVIL) 600 MG tablet, Take 1 tablet (600 mg total)  by mouth every 6 (six) hours as needed., Disp: 30 tablet, Rfl: 0   ibuprofen (ADVIL) 800 MG tablet, Take 1 tablet (800 mg total) by mouth 3 (three) times daily., Disp: 30 tablet, Rfl: 0  Observations/Objective: Patient is well-developed, well-nourished in no acute distress.  Resting comfortably  at home.  Head is normocephalic, atraumatic.  No labored breathing.  Speech is clear and coherent with logical content.  Patient is alert and oriented at baseline.    Assessment and Plan: 1. Acute pain of right knee Continue ice and ibuprofen with  knee brace until seen later today in person   Visit mainly to get work note in order to leave and make it to UC in time today   Patient has plan for in person evaluation and will return to work after today based on the evaluation of the provider he sees at [facility] later today       Follow Up Instructions: I discussed the assessment and treatment plan with the patient. The patient was provided an opportunity to ask questions and all were answered. The patient agreed with the plan and demonstrated an understanding of the instructions.  A copy of instructions were sent to the patient via MyChart unless otherwise noted below.    The patient was advised to call back or seek an in-person evaluation if the symptoms worsen or if the condition fails to improve as anticipated.  Time:  I spent 11 minutes with the patient via telehealth technology discussing the above problems/concerns.    Francisco Simas, Francisco Patterson

## 2023-02-02 ENCOUNTER — Emergency Department (HOSPITAL_COMMUNITY)
Admission: EM | Admit: 2023-02-02 | Discharge: 2023-02-03 | Discharge status: Against Medical Advice | Payer: BC Managed Care – PPO | Source: Home / Self Care

## 2023-02-02 ENCOUNTER — Encounter (HOSPITAL_COMMUNITY): Payer: Self-pay

## 2023-02-02 ENCOUNTER — Telehealth: Payer: BC Managed Care – PPO | Admitting: Family Medicine

## 2023-02-02 ENCOUNTER — Other Ambulatory Visit: Payer: Self-pay

## 2023-02-02 DIAGNOSIS — H538 Other visual disturbances: Secondary | ICD-10-CM | POA: Insufficient documentation

## 2023-02-02 DIAGNOSIS — W228XXA Striking against or struck by other objects, initial encounter: Secondary | ICD-10-CM | POA: Insufficient documentation

## 2023-02-02 DIAGNOSIS — S058X1A Other injuries of right eye and orbit, initial encounter: Secondary | ICD-10-CM

## 2023-02-02 DIAGNOSIS — Z5321 Procedure and treatment not carried out due to patient leaving prior to being seen by health care provider: Secondary | ICD-10-CM | POA: Insufficient documentation

## 2023-02-02 NOTE — Patient Instructions (Signed)
Beverly Gust, thank you for joining Reed Pandy, PA-C for today's virtual visit.  While this provider is not your primary care provider (PCP), if your PCP is located in our provider database this encounter information will be shared with them immediately following your visit.   A [facility] MyChart account gives you access to today's visit and all your visits, tests, and labs performed at [facility] " click here if you don't have a [facility] MyChart account or go to [email or web address]  Consent: (Patient) Francisco Patterson provided verbal consent for this virtual visit at the beginning of the encounter.  Current Medications:  Current Outpatient Medications:    ibuprofen (ADVIL) 600 MG tablet, Take 1 tablet (600 mg total) by mouth every 6 (six) hours as needed., Disp: 30 tablet, Rfl: 0   ibuprofen (ADVIL) 800 MG tablet, Take 1 tablet (800 mg total) by mouth 3 (three) times daily., Disp: 30 tablet, Rfl: 0   Medications ordered in this encounter:  No orders of the defined types were placed in this encounter.    *If you need refills on other medications prior to your next appointment, please contact your pharmacy*  Follow-Up: Call back or seek an in-person evaluation if the symptoms worsen or if the condition fails to improve as anticipated.  [facility] [phone]  Other Instructions Corneal Abrasion  A corneal abrasion is a scratch or injury to the clear tissue at the front of the eye (cornea). It can be very painful. The cornea protects the eye and helps the eye focus. The cornea is made up of many layers, but the surface layer is one of the most sensitive tissues in the body. A corneal abrasion heals fast when it is treated. If it is not treated, it can become infected and cause an open sore (ulcer). This can lead to scarring, and the scarring can affect vision. Sometimes after the abrasion heals, another abrasion can come back in the same  place. What are the causes? A corneal abrasion may be caused by: A poke to the eye. A gritty or irritating substance (foreign body) in the eye. Too much eye rubbing. Very dry eyes. Certain eye infections. Contact lenses that do not fit right or are worn for too long. Injury can also happen when putting in contact lenses or taking them out. Eye surgery. Certain cornea problems that make it more likely to get a corneal abrasion. Sometimes, the cause is not known. What are the signs or symptoms? Symptoms of this condition include: Eye pain. The pain may get worse when you open, close, or move your eye. A feeling that something is poking your eye or is stuck in your eye. Tearing, redness, and sensitivity to light. Having trouble keeping your eye open, or not being able to keep it open. Blurred vision. Headache. How is this diagnosed? A corneal abrasion may be diagnosed based on your medical history, symptoms, and an eye exam. You may need to see a doctor who specializes in conditions of the eye (ophthalmologist or optometrist). Before the eye exam, you may be given eye drops that numb the eye. You may also have dye put in your eye with a dropper or a small paper strip. This dye helps your eye doctor see your injury better when using a light or an eye scope (slit lamp). How is this treated? Treatment may vary based on what caused the corneal abrasion. It may include: Washing out your eye. Taking out anything  that is stuck in your eye. Using antibiotic drops or ointment to treat or prevent an infection. Using eye drops that lessens inflammation and pain. Using steroid drops or ointment to treat redness, irritation, or inflammation. Applying a cold, wet cloth (cold compress) or ice pack to lessen the pain. Taking pain medicines. This may include over-the-counter medicines like ibuprofen. If you have a large or deep corneal abrasion, an opioid medicine may be prescribed. For large abrasions,  an eye patch or a bandage soft contact lens might also be used. A bandage soft contact lens protects the cornea while it is healing. These are not used if the corneal abrasion is related to wearing contact lenses. This is because you may be more likely to get an eye infection. Follow these instructions at home: Medicines If you were prescribed eye drops or ointment, use them as told by your provider. You may be told to use: Antibiotic eye drops or ointment. Do not stop using them even if you start to feel better. Eye drops that moisten the eye (lubricating eye drops). Ask your provider if the medicine prescribed to you: Requires you to avoid driving or using machinery. Can cause constipation. You may need to take these actions to prevent or treat constipation: Drink enough fluid to keep your pee (urine) pale yellow. Take over-the-counter or prescription medicines. Eat foods that are high in fiber, such as beans, whole grains, and fresh fruits and vegetables. Limit foods that are high in fat and processed sugars, such as fried or sweet foods. Take over-the-counter and prescription medicines only as told by your provider. Eye care Rest your eyes. Avoid bright light and overusing (straining) your eyes. Wear sunglasses. Do not rub or touch your eye. Do not wash out your eye. Ask your provider if you can use a cold compress on your eye to lessen pain. If you have an eye patch: Wear it as told by your provider. Follow your provider's instructions about when to take it off. If you have a bandage soft contact lens, your provider will take it off during your follow-up visit. Do not wear your own contact lenses until your provider says it is okay. General instructions Do not drive or use machinery as told by your provider. You may not be able to judge distances as well if your vision is affected or if you are wearing an eye patch. Keep all follow-up visits. This helps to prevent infection and vision  loss. Contact a health care provider if: You keep having eye pain and other symptoms for more than 2-3 days. You have new symptoms, such as more redness, tearing, or fluid (discharge) coming from your eye. You have swollen eyelids. Your vision gets much worse. You have discharge that makes your eyelids stick together in the morning. Your eye patch becomes so loose that you can blink your eye. Symptoms come back after your abrasion heals. Get help right away if: You have severe eye pain that does not get better with medicine. You have severe vision loss. This information is not intended to replace advice given to you by your health care provider. Make sure you discuss any questions you have with your health care provider. Document Revised: 06/28/2022 Document Reviewed: 06/28/2022 Elsevier Patient Education  2024 Elsevier Inc.    If you have been instructed to have an in-person evaluation today at a local Urgent Care facility, please use the link below. It will take you to a list of all of our available [facility], including address, phone number and hours of operation. Please do not delay care.  [facility]  If you or a family member do not have a primary care provider, use the link below to schedule a visit and establish care. When you choose a [facility] primary care physician or advanced practice provider, you gain a long-term partner in health. Find a Primary Care Provider  Learn more about [facility]'s in-office and virtual care options: [facility] - Get Care Now

## 2023-02-02 NOTE — Progress Notes (Signed)
Virtual Visit Consent   Francisco Patterson, you are scheduled for a virtual visit with a [facility] provider today. Just as with appointments in the office, your consent must be obtained to participate. Your consent will be active for this visit and any virtual visit you may have with one of our providers in the next 365 days. If you have a MyChart account, a copy of this consent can be sent to you electronically.  As this is a virtual visit, video technology does not allow for your provider to perform a traditional examination. This may limit your provider's ability to fully assess your condition. If your provider identifies any concerns that need to be evaluated in person or the need to arrange testing (such as labs, EKG, etc.), we will make arrangements to do so. Although advances in technology are sophisticated, we cannot ensure that it will always work on either your end or our end. If the connection with a video visit is poor, the visit may have to be switched to a telephone visit. With either a video or telephone visit, we are not always able to ensure that we have a secure connection.  By engaging in this virtual visit, you consent to the provision of healthcare and authorize for your insurance to be billed (if applicable) for the services provided during this visit. Depending on your insurance coverage, you may receive a charge related to this service.  I need to obtain your verbal consent now. Are you willing to proceed with your visit today? Francisco Patterson has provided verbal consent on 02/02/2023 for a virtual visit (video or telephone). Reed Pandy, [place]  Date: 02/02/2023 5:28 PM  Virtual Visit via Video Note   I, Reed Pandy, connected with  Francisco Patterson  ([id number], 1991/02/08) on 02/02/23 at  5:15 PM EDT by a video-enabled telemedicine application and verified that I am speaking with the correct person using two identifiers.  Location: Patient: Virtual Visit Location Patient:  Mobile Provider: Virtual Visit Location Provider: Home Office   I discussed the limitations of evaluation and management by telemedicine and the availability of in person appointments. The patient expressed understanding and agreed to proceed.    History of Present Illness: Francisco Patterson is a 32 y.o. who identifies as a male who was assigned male at birth, and is being seen today for c/o going fishing with son and rod hit him in right eye. Pt states vision is blurry and is currently unable to see out of eye.  Pt states he is currently driving himself to the emergency room but wanted to get on the call and see if he was suppose to go to the emergency room right now.   HPI: HPI  Problems: There are no problems to display for this patient.   Allergies: No Known Allergies Medications:  Current Outpatient Medications:    ibuprofen (ADVIL) 600 MG tablet, Take 1 tablet (600 mg total) by mouth every 6 (six) hours as needed., Disp: 30 tablet, Rfl: 0   ibuprofen (ADVIL) 800 MG tablet, Take 1 tablet (800 mg total) by mouth 3 (three) times daily., Disp: 30 tablet, Rfl: 0  Observations/Objective: Patient is well-developed, well-nourished in no acute distress.  Resting comfortably in his car Head is normocephalic, atraumatic.  No labored breathing.  Speech is clear and coherent with logical content.  Patient is alert and oriented at baseline.  Right eye with erythema and swelling with clear discharge.  Assessment and Plan: 1. Abrasion of  right eye, initial encounter  -Advised patient to head to the emergency room as soon as he could for further evaluation of eye.   Follow Up Instructions: I discussed the assessment and treatment plan with the patient. The patient was provided an opportunity to ask questions and all were answered. The patient agreed with the plan and demonstrated an understanding of the instructions.  A copy of instructions were sent to the patient via MyChart unless otherwise  noted below.     The patient was advised to call back or seek an in-person evaluation if the symptoms worsen or if the condition fails to improve as anticipated.  Time:  I spent minutes 10 with the patient via telehealth technology discussing the above problems/concerns.    Reed Pandy, PA-C

## 2023-02-02 NOTE — ED Triage Notes (Signed)
Pt came in via POV d/t getting hit in the Rt eye with his sons fishing pole today. Endorses blurred vision, & if he covers up his good eye all he sees is light. He does reports that he can see shaped but not very clearly. A/Ox4, denies pain just feels irritating & like something is in his eye & it itches.

## 2023-02-02 NOTE — ED Notes (Signed)
Patient's name called 4 times in the waiting room with no response. Patient moved off the floor.

## 2024-03-02 DIAGNOSIS — F4322 Adjustment disorder with anxiety: Secondary | ICD-10-CM | POA: Diagnosis not present

## 2024-03-10 DIAGNOSIS — F4322 Adjustment disorder with anxiety: Secondary | ICD-10-CM | POA: Diagnosis not present

## 2024-03-25 DIAGNOSIS — F4322 Adjustment disorder with anxiety: Secondary | ICD-10-CM | POA: Diagnosis not present
